# Patient Record
Sex: Female | Born: 1990 | Race: Black or African American | Hispanic: No | Marital: Single | Smoking: Current some day smoker
Health system: Southern US, Community
[De-identification: ages and names within clinical notes are randomized; demographics above are authoritative.]

## PROBLEM LIST (undated history)

## (undated) DIAGNOSIS — R7303 Prediabetes: Secondary | ICD-10-CM

## (undated) DIAGNOSIS — K219 Gastro-esophageal reflux disease without esophagitis: Secondary | ICD-10-CM

## (undated) DIAGNOSIS — N939 Abnormal uterine and vaginal bleeding, unspecified: Secondary | ICD-10-CM

## (undated) DIAGNOSIS — F329 Major depressive disorder, single episode, unspecified: Secondary | ICD-10-CM

## (undated) DIAGNOSIS — R519 Headache, unspecified: Secondary | ICD-10-CM

## (undated) DIAGNOSIS — R51 Headache: Secondary | ICD-10-CM

## (undated) DIAGNOSIS — F32A Depression, unspecified: Secondary | ICD-10-CM

## (undated) HISTORY — PX: NO PAST SURGERIES: SHX2092

## (undated) HISTORY — DX: Abnormal uterine and vaginal bleeding, unspecified: N93.9

---

## 2015-07-03 ENCOUNTER — Emergency Department
Admission: EM | Admit: 2015-07-03 | Discharge: 2015-07-03 | Disposition: A | Discharge status: Home / Self Care | Payer: BLUE CROSS/BLUE SHIELD | Attending: Emergency Medicine | Admitting: Emergency Medicine

## 2015-07-03 DIAGNOSIS — S3992XA Unspecified injury of lower back, initial encounter: Secondary | ICD-10-CM | POA: Diagnosis present

## 2015-07-03 DIAGNOSIS — Y9389 Activity, other specified: Secondary | ICD-10-CM | POA: Insufficient documentation

## 2015-07-03 DIAGNOSIS — Y990 Civilian activity done for income or pay: Secondary | ICD-10-CM | POA: Diagnosis not present

## 2015-07-03 DIAGNOSIS — Y9289 Other specified places as the place of occurrence of the external cause: Secondary | ICD-10-CM | POA: Insufficient documentation

## 2015-07-03 DIAGNOSIS — X58XXXA Exposure to other specified factors, initial encounter: Secondary | ICD-10-CM | POA: Diagnosis not present

## 2015-07-03 DIAGNOSIS — S39012A Strain of muscle, fascia and tendon of lower back, initial encounter: Secondary | ICD-10-CM | POA: Insufficient documentation

## 2015-07-03 MED ORDER — CYCLOBENZAPRINE HCL 10 MG PO TABS
5.0000 mg | ORAL_TABLET | ORAL | Status: AC
  Administered 2015-07-03: 5 mg via ORAL

## 2015-07-03 MED ORDER — IBUPROFEN 600 MG PO TABS: ORAL_TABLET | ORAL | Status: DC

## 2015-07-03 MED ORDER — IBUPROFEN 600 MG PO TABS
600.0000 mg | ORAL_TABLET | ORAL | Status: AC
  Administered 2015-07-03: 600 mg via ORAL

## 2015-07-03 MED ORDER — CYCLOBENZAPRINE HCL 5 MG PO TABS: 5.0000 mg | ORAL_TABLET | Freq: Three times a day (TID) | ORAL | Status: DC | PRN

## 2015-07-03 MED ORDER — HYDROCODONE-ACETAMINOPHEN 5-325 MG PO TABS
1.0000 | ORAL_TABLET | ORAL | Status: AC
  Administered 2015-07-03: 1 via ORAL

## 2015-07-03 NOTE — ED Notes (Signed)
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

## 2015-07-03 NOTE — Discharge Instructions (Signed)
You have been seen in the Emergency Department (ED)  today for back pain.  You are likely suffering from muscle strain or possible problems with your discs, but there is no treatment that will fix your symptoms at this time.  Please take Motrin (ibuprofen) as needed for your pain according to the instructions written on the box.  Alternatively, for the next five days you can take 600mg  three times daily with meals (it may upset your stomach).  Please follow up with your doctor as soon as possible regarding today's ED visit and your back pain.  Return to the ED for worsening back pain, fever, weakness or numbness of either leg, or if you develop either (1) an inability to urinate or have bowel movements, or (2) loss of your ability to control your bathroom functions (if you start having "accidents"), or if you develop other new symptoms that concern you.   Low Back Strain A strain is an injury in which a tendon or muscle is torn. The muscles and tendons of the lower back are vulnerable to strains. However, these muscles and tendons are very strong and require a great force to be injured. Strains are classified into three categories. Grade 1 strains cause pain, but the tendon is not lengthened. Grade 2 strains include a lengthened ligament, due to the ligament being stretched or partially ruptured. With grade 2 strains there is still function, although the function may be decreased. Grade 3 strains involve a complete tear of the tendon or muscle, and function is usually impaired. SYMPTOMS   Pain in the lower back.  Pain that affects one side more than the other.  Pain that gets worse with movement and may be felt in the hip, buttocks, or back of the thigh.  Muscle spasms of the muscles in the back.  Swelling along the muscles of the back.  Loss of strength of the back muscles.  Crackling sound (crepitation) when the muscles are touched. CAUSES  Lower back strains occur when a force is placed on  the muscles or tendons that is greater than they can handle. Common causes of injury include:  Prolonged overuse of the muscle-tendon units in the lower back, usually from incorrect posture.  A single violent injury or force applied to the back. RISK INCREASES WITH:  Sports that involve twisting forces on the spine or a lot of bending at the waist (football, rugby, weightlifting, bowling, golf, tennis, speed skating, racquetball, swimming, running, gymnastics, diving).  Poor strength and flexibility.  Failure to warm up properly before activity.  Family history of lower back pain or disk disorders.  Previous back injury or surgery (especially fusion).  Poor posture with lifting, especially heavy objects.  Prolonged sitting, especially with poor posture. PREVENTION   Learn and use proper posture when sitting or lifting (maintain proper posture when sitting, lift using the knees and legs, not at the waist).  Warm up and stretch properly before activity.  Allow for adequate recovery between workouts.  Maintain physical fitness:  Strength, flexibility, and endurance.  Cardiovascular fitness. PROGNOSIS  If treated properly, lower back strains usually heal within 6 weeks. RELATED COMPLICATIONS   Recurring symptoms, resulting in a chronic problem.  Chronic inflammation, scarring, and partial muscle-tendon tear.  Delayed healing or resolution of symptoms.  Prolonged disability. TREATMENT  Treatment first involves the use of ice and medicine, to reduce pain and inflammation. The use of strengthening and stretching exercises may help reduce pain with activity. These exercises may be  performed at home or with a therapist. Severe injuries may require referral to a therapist for further evaluation and treatment, such as ultrasound. Your caregiver may advise that you wear a back brace or corset, to help reduce pain and discomfort. Often, prolonged bed rest results in greater harm then  benefit. Corticosteroid injections may be recommended. However, these should be reserved for the most serious cases. It is important to avoid using your back when lifting objects. At night, sleep on your back on a firm mattress with a pillow placed under your knees. If non-surgical treatment is unsuccessful, surgery may be needed.  MEDICATION   If pain medicine is needed, nonsteroidal anti-inflammatory medicines (aspirin and ibuprofen), or other minor pain relievers (acetaminophen), are often advised.  Do not take pain medicine for 7 days before surgery.  Prescription pain relievers may be given, if your caregiver thinks they are needed. Use only as directed and only as much as you need.  Ointments applied to the skin may be helpful.  Corticosteroid injections may be given by your caregiver. These injections should be reserved for the most serious cases, because they may only be given a certain number of times. HEAT AND COLD  Cold treatment (icing) should be applied for 10 to 15 minutes every 2 to 3 hours for inflammation and pain, and immediately after activity that aggravates your symptoms. Use ice packs or an ice massage.  Heat treatment may be used before performing stretching and strengthening activities prescribed by your caregiver, physical therapist, or athletic trainer. Use a heat pack or a warm water soak. SEEK MEDICAL CARE IF:   Symptoms get worse or do not improve in 2 to 4 weeks, despite treatment.  You develop numbness, weakness, or loss of bowel or bladder function.  New, unexplained symptoms develop. (Drugs used in treatment may produce side effects.)

## 2015-07-03 NOTE — ED Provider Notes (Signed)
[facility] Emergency Department Provider Note  ____________________________________________  Time seen: Approximately 1:01 AM  I have reviewed the triage vital signs and the nursing notes.   HISTORY  Chief Complaint Back Pain    HPI Briana Sexton is a 24 y.o. female with no past medical history other than morbid obesity who presents with low back pain for the last 7-8 hours.  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  It is a sharp and cramping pain that waxes and wanes.Movement makes it worse and nothing makes it better.  She is able to ambulate, has no numbness or weakness in her saddle region or in any of her extremities, and has had no urinary retention/incontinence nor bowel incontinence.   No past medical history on file.  There are no active problems to display for this patient.   No past surgical history on file.  Current Outpatient Rx  Name  Route  Sig  Dispense  Refill  . cyclobenzaprine (FLEXERIL) 5 MG tablet   Oral   Take 1 tablet (5 mg total) by mouth every 8 (eight) hours as needed for muscle spasms.   21 tablet   1   . ibuprofen (ADVIL,MOTRIN) 600 MG tablet      Take 1 tablet by mouth three times daily with meals   15 tablet   0     Allergies Review of patient's allergies indicates no known allergies.  No family history on file.  Social History Social History  Substance Use Topics  . Smoking status: Not on file  . Smokeless tobacco: Not on file  . Alcohol Use: Not on file    Review of Systems Constitutional: No fever/chills Eyes: No visual changes. ENT: No sore throat. Cardiovascular: Denies chest pain. Respiratory: Denies shortness of breath. Gastrointestinal: No abdominal pain.  No nausea, no vomiting.  No diarrhea.  No constipation. Genitourinary: Negative for dysuria. Musculoskeletal: Low back pain in the middle Skin: Negative for  rash. Neurological: Negative for headaches, focal weakness or numbness.  10-point ROS otherwise negative.  ____________________________________________   PHYSICAL EXAM:  VITAL SIGNS: ED Triage Vitals  Enc Vitals Group     BP 07/03/15 0027 129/69 mmHg     Pulse Rate 07/03/15 0027 89     Resp 07/03/15 0027 20     Temp 07/03/15 0027 98.3 F (36.8 C)     Temp Source 07/03/15 0027 Oral     SpO2 07/03/15 0027 100 %     Weight 07/03/15 0027 234 lb (106.142 kg)     Height 07/03/15 0027  (1.6 m)     Head Cir --      Peak Flow --      Pain Score 07/03/15 0027 4     Pain Loc --      Pain Edu? --      Excl. in GC? --     Constitutional: Alert and oriented. Well appearing and in no acute distress. Eyes: Conjunctivae are normal. PERRL. EOMI. Head: Atraumatic. Nose: No congestion/rhinnorhea. Mouth/Throat: Mucous membranes are moist.  Oropharynx non-erythematous. Cardiovascular: Normal rate, regular rhythm. Grossly normal heart sounds.  Good peripheral circulation. Respiratory: Normal respiratory effort.  No retractions. Lungs CTAB. Gastrointestinal: Soft and nontender. No distention. No abdominal bruits. No CVA tenderness. Musculoskeletal: Pain in the low back in the middle without any bony point tenderness or step-offs or deformities. Neurologic:  Normal speech and language.  No gross focal neurologic deficits are appreciated.  Ambulates with a slight limp Skin:  Skin is warm, dry and intact. No rash noted. Psychiatric: Mood and affect are normal. Speech and behavior are normal.  ____________________________________________   LABS (all labs ordered are listed, but only abnormal results are displayed)  Labs Reviewed - No data to display ____________________________________________  EKG  Not indicated ____________________________________________  RADIOLOGY   No results found.  ____________________________________________   PROCEDURES  Procedure(s) performed:  None  Critical Care performed: No ____________________________________________   INITIAL IMPRESSION / ASSESSMENT AND PLAN / ED COURSE  Pertinent labs & imaging results that were available during my care of the patient were reviewed by me and considered in my medical decision making (see chart for details).  Well-appearing, suspect musculoskeletal lower back strain.  No evidence of neurological deficits or anything concerning for cauda equina syndrome.  I will treat her symptomatically and give her my usual and customary return precautions.  She agrees with this plan.  ____________________________________________  FINAL CLINICAL IMPRESSION(S) / ED DIAGNOSES  Final diagnoses:  Low back strain, initial encounter      NEW MEDICATIONS STARTED DURING THIS VISIT:  Discharge Medication List as of 07/03/2015  1:15 AM    START taking these medications   Details  cyclobenzaprine (FLEXERIL) 5 MG tablet Take 1 tablet (5 mg total) by mouth every 8 (eight) hours as needed for muscle spasms., Starting 07/03/2015, Until Mon 07/02/16, Print    ibuprofen (ADVIL,MOTRIN) 600 MG tablet Take 1 tablet by mouth three times daily with meals, Print         Loleta Roseory Dreshaun Stene, MD 07/03/15 [phone]

## 2015-07-09 ENCOUNTER — Ambulatory Visit (INDEPENDENT_AMBULATORY_CARE_PROVIDER_SITE_OTHER): Payer: BLUE CROSS/BLUE SHIELD | Admitting: Urgent Care

## 2015-07-09 VITALS — BP 124/78 | HR 84 | Temp 98.7°F | Resp 16 | Ht 63.0 in | Wt 241.0 lb

## 2015-07-09 DIAGNOSIS — Z3009 Encounter for other general counseling and advice on contraception: Secondary | ICD-10-CM

## 2015-07-09 DIAGNOSIS — R102 Pelvic and perineal pain: Secondary | ICD-10-CM | POA: Diagnosis not present

## 2015-07-09 DIAGNOSIS — B373 Candidiasis of vulva and vagina: Secondary | ICD-10-CM

## 2015-07-09 DIAGNOSIS — B3731 Acute candidiasis of vulva and vagina: Secondary | ICD-10-CM

## 2015-07-09 DIAGNOSIS — R8290 Unspecified abnormal findings in urine: Secondary | ICD-10-CM

## 2015-07-09 DIAGNOSIS — R300 Dysuria: Secondary | ICD-10-CM

## 2015-07-09 LAB — POCT URINALYSIS DIP (MANUAL ENTRY)
BILIRUBIN UA: NEGATIVE
Bilirubin, UA: NEGATIVE
Blood, UA: NEGATIVE
GLUCOSE UA: NEGATIVE
Leukocytes, UA: NEGATIVE
Nitrite, UA: NEGATIVE
Protein Ur, POC: NEGATIVE
SPEC GRAV UA: 1.025
Urobilinogen, UA: 0.2
pH, UA: 6

## 2015-07-09 LAB — POC MICROSCOPIC URINALYSIS (UMFC): Yeast: POSITIVE

## 2015-07-09 LAB — POCT WET + KOH PREP
Trich by wet prep: ABSENT
YEAST BY KOH: ABSENT
YEAST BY WET PREP: ABSENT

## 2015-07-09 MED ORDER — FLUCONAZOLE 150 MG PO TABS
150.0000 mg | ORAL_TABLET | Freq: Once | ORAL | Status: DC
Start: 2015-07-09 — End: 2017-10-09

## 2015-07-09 MED ORDER — NORGESTIMATE-ETH ESTRADIOL 0.25-35 MG-MCG PO TABS: 1.0000 | ORAL_TABLET | Freq: Every day | ORAL | Status: DC

## 2015-07-09 NOTE — Progress Notes (Signed)
MRN: [id number] DOB: [birth year]  Subjective:   Briana Sexton is a 24 y.o. female presenting for chief complaint of Dysuria  Malodorous urine - Reports 3 day history of hematuria and cloudy malordorous urine, low back pain that started ~1 week ago. Has not tried any medications for her urinary symptoms. She was seen in the ED for her low back pain, thought be musculoskeletal in etiology, rx ibuprofen and flexeril with significant relief of her back pain. Has also had constipation this week, straining when she defecates, hard stools, bowel movements every other 1-2 days. Denies dysuria, urinary frequency, urinary urgency, flank pain, abdominal pain, pelvic pain, genital rash, genital irritation and vaginal discharge.   OCP - Patient would like oral contraception. States that she has not had a pap smear performed. She saw a gynecologist for an acute vaginal bleeding episode associated with sex, was told that she had a "problem" with her cervix. She has not had OB/GYN care since then. Denies irregular, heavy cycles. LMP was 06/16/2015.  Denies any other aggravating or relieving factors, no other questions or concerns.  Briana Sexton has a current medication list which includes the following prescription(s): ibuprofen. She has No Known Allergies.  Briana Sexton  has no past medical history on file. Also  has no past surgical history on file.  ROS As in subjective.  Objective:   Vitals: BP 124/78 mmHg  Pulse 84  Temp(Src) 98.7 F (37.1 C) (Oral)  Resp 16  Ht  (1.6 m)  Wt 241 lb (109.317 kg)  BMI 42.70 kg/m2  SpO2 98%  LMP 06/19/2015 (Approximate)  Physical Exam  Constitutional: She is oriented to person, place, and time. She appears well-developed and well-nourished.  Cardiovascular: Normal rate, regular rhythm and intact distal pulses.  Exam reveals no gallop and no friction rub.   No murmur heard. Pulmonary/Chest: No respiratory distress. She has no wheezes. She has no rales.    Abdominal: Soft. Bowel sounds are normal. She exhibits no distension and no mass. There is tenderness (mid pelvic tenderness with deep palpation).  No CVA tenderness.  Genitourinary: No labial fusion. There is no rash, tenderness, lesion or injury on the right labia. There is no rash, tenderness, lesion or injury on the left labia. Uterus is not deviated, not enlarged, not fixed and not tender. Cervix exhibits no motion tenderness, no discharge and no friability. Right adnexum displays no mass, no tenderness and no fullness. Left adnexum displays no mass, no tenderness and no fullness. No erythema, tenderness or bleeding in the vagina. No foreign body around the vagina. No signs of injury around the vagina. Vaginal discharge (white, curdy discharge) found.  Musculoskeletal: She exhibits no edema.  Lymphadenopathy:       Right: No inguinal adenopathy present.       Left: No inguinal adenopathy present.  Neurological: She is alert and oriented to person, place, and time.  Skin: Skin is warm and dry. No rash noted. No erythema. No pallor.   Results for orders placed or performed in visit on 07/09/15 (from the past 24 hour(s))  POCT urinalysis dipstick     Status: Abnormal   Collection Time: 07/09/15 12:58 PM  Result Value Ref Range   Color, UA yellow yellow   Clarity, UA cloudy (A) clear   Glucose, UA negative negative   Bilirubin, UA negative negative   Ketones, POC UA negative negative   Spec Grav, UA 1.025    Blood, UA negative negative   pH, UA 6.0  Protein Ur, POC negative negative   Urobilinogen, UA 0.2    Nitrite, UA Negative Negative   Leukocytes, UA Negative Negative  POCT Microscopic Urinalysis ([facility])     Status: Abnormal   Collection Time: 07/09/15 12:58 PM  Result Value Ref Range   WBC,UR,HPF,POC Few (A) None WBC/hpf   RBC,UR,HPF,POC None None RBC/hpf   Bacteria Many (A) None, Too numerous to count   Mucus Present (A) Absent   Epithelial Cells, UR Per Microscopy Moderate  (A) None, Too numerous to count cells/hpf   Yeast pos   POCT Wet + KOH Prep     Status: Abnormal   Collection Time: 07/09/15 12:59 PM  Result Value Ref Range   Yeast by KOH Absent Present, Absent   Yeast by wet prep Absent Present, Absent   WBC by wet prep Few None, Few, Too numerous to count   Clue Cells Wet Prep HPF POC None None, Too numerous to count   Trich by wet prep Absent Present, Absent   Bacteria Wet Prep HPF POC Moderate (A) None, Few, Too numerous to count   Epithelial Cells By Principal FinancialWet Pref ([facility]) Moderate (A) None, Few, Too numerous to count   RBC,UR,HPF,POC None None RBC/hpf    Assessment and Plan :   1. Yeast vaginitis 2. Dysuria 3. Abnormal urinalysis 4. Pelvic pain in female - Start diflucan today, consider Flagyl if no improvement in 1 week. Also consider further testing/imaging if sx do not resolve, pelvic US, diabetes check. Patient verbalized understanding.   5. Encounter for other general counseling or advice on contraception - Labs pending, OCP instructions provided, counseled on safe sex practices. RTC in 1 year for refill.  Wallis BambergMario Tay Whitwell, PA-C [facility] [phone] 07/09/2015 11:44 AM

## 2015-07-09 NOTE — Patient Instructions (Signed)
Monilial Vaginitis Vaginitis in a soreness, swelling and redness (inflammation) of the vagina and vulva. Monilial vaginitis is not a sexually transmitted infection. CAUSES  Yeast vaginitis is caused by yeast (candida) that is normally found in your vagina. With a yeast infection, the candida has overgrown in number to a point that upsets the chemical balance. SYMPTOMS   White, thick vaginal discharge.  Swelling, itching, redness and irritation of the vagina and possibly the lips of the vagina (vulva).  Burning or painful urination.  Painful intercourse. DIAGNOSIS  Things that may contribute to monilial vaginitis are:  Postmenopausal and virginal states.  Pregnancy.  Infections.  Being tired, sick or stressed, especially if you had monilial vaginitis in the past.  Diabetes. Good control will help lower the chance.  Birth control pills.  Tight fitting garments.  Using bubble bath, feminine sprays, douches or deodorant tampons.  Taking certain medications that kill germs (antibiotics).  Sporadic recurrence can occur if you become ill. TREATMENT  Your caregiver will give you medication.  There are several kinds of anti monilial vaginal creams and suppositories specific for monilial vaginitis. For recurrent yeast infections, use a suppository or cream in the vagina 2 times a week, or as directed.  Anti-monilial or steroid cream for the itching or irritation of the vulva may also be used. Get your caregiver's permission.  Painting the vagina with methylene blue solution may help if the monilial cream does not work.  Eating yogurt may help prevent monilial vaginitis. HOME CARE INSTRUCTIONS   Finish all medication as prescribed.  Do not have sex until treatment is completed or after your caregiver tells you it is okay.  Take warm sitz baths.  Do not douche.  Do not use tampons, especially scented ones.  Wear cotton underwear.  Avoid tight pants and panty  hose.  Tell your sexual partner that you have a yeast infection. They should go to their caregiver if they have symptoms such as mild rash or itching.  Your sexual partner should be treated as well if your infection is difficult to eliminate.  Practice safer sex. Use condoms.  Some vaginal medications cause latex condoms to fail. Vaginal medications that harm condoms are:  Cleocin cream.  Butoconazole (Femstat).  Terconazole (Terazol) vaginal suppository.  Miconazole (Monistat) (may be purchased over the counter). SEEK MEDICAL CARE IF:   You have a temperature by mouth above 102 F (38.9 C).  The infection is getting worse after 2 days of treatment.  The infection is not getting better after 3 days of treatment.  You develop blisters in or around your vagina.  You develop vaginal bleeding, and it is not your menstrual period.  You have pain when you urinate.  You develop intestinal problems.  You have pain with sexual intercourse.   This information is not intended to replace advice given to you by your health care provider. Make sure you discuss any questions you have with your health care provider.   Document Released: 05/23/2005 Document Revised: 11/05/2011 Document Reviewed: 02/14/2015 Elsevier Interactive Patient Education 2016 [employer].   Safe Sex Safe sex is about reducing the risk of giving or getting a sexually transmitted disease (STD). STDs are spread through sexual contact involving the genitals, mouth, or rectum. Some STDs can be cured and others cannot. Safe sex can also prevent unintended pregnancies.  WHAT ARE SOME SAFE SEX PRACTICES?  Limit your sexual activity to only one partner who is having sex with only you.  Talk to your  partner about his or her past partners, past STDs, and drug use.  Use a condom every time you have sexual intercourse. This includes vaginal, oral, and anal sexual activity. Both females and males should wear condoms  during oral sex. Only use latex or polyurethane condoms and water-based lubricants. Using petroleum-based lubricants or oils to lubricate a condom will weaken the condom and increase the chance that it will break. The condom should be in place from the beginning to the end of sexual activity. Wearing a condom reduces, but does not completely eliminate, your risk of getting or giving an STD. STDs can be spread by contact with infected body fluids and skin.  Get vaccinated for hepatitis B and HPV.  Avoid alcohol and recreational drugs, which can affect your judgment. You may forget to use a condom or participate in high-risk sex.  For females, avoid douching after sexual intercourse. Douching can spread an infection farther into the reproductive tract.  Check your body for signs of sores, blisters, rashes, or unusual discharge. See your health care provider if you notice any of these signs.  Avoid sexual contact if you have symptoms of an infection or are being treated for an STD. If you or your partner has herpes, avoid sexual contact when blisters are present. Use condoms at all other times.  If you are at risk of being infected with HIV, it is recommended that you take a prescription medicine daily to prevent HIV infection. This is called pre-exposure prophylaxis (PrEP). You are considered at risk if:  You are a man who has sex with other men (MSM).  You are a heterosexual man or woman who is sexually active with more than one partner.  You take drugs by injection.  You are sexually active with a partner who has HIV.  Talk with your health care provider about whether you are at high risk of being infected with HIV. If you choose to begin PrEP, you should first be tested for HIV. You should then be tested every 3 months for as long as you are taking PrEP.  See your health care provider for regular screenings, exams, and tests for other STDs. Before having sex with a new partner, each of you  should be screened for STDs and should talk about the results with each other. WHAT ARE THE BENEFITS OF SAFE SEX?   There is less chance of getting or giving an STD.  You can prevent unwanted or unintended pregnancies.  By discussing safe sex concerns with your partner, you may increase feelings of intimacy, comfort, trust, and honesty between the two of you.   This information is not intended to replace advice given to you by your health care provider. Make sure you discuss any questions you have with your health care provider.   Document Released: 09/20/2004 Document Revised: 09/03/2014 Document Reviewed: 02/04/2012 Elsevier Interactive Patient Education [employer]2016 Elsevier Inc.

## 2015-07-12 LAB — PAP IG W/ RFLX HPV ASCU

## 2015-07-14 ENCOUNTER — Telehealth: Payer: Self-pay | Admitting: Urgent Care

## 2015-07-14 MED ORDER — NITROFURANTOIN MONOHYD MACRO 100 MG PO CAPS: 100.0000 mg | ORAL_CAPSULE | Freq: Two times a day (BID) | ORAL | Status: DC

## 2015-07-14 NOTE — Telephone Encounter (Signed)
Reported normal pap smear. Repeat again in 3 years. She reports that she took diflucan twice already. She reports that she is now having dysuria, urinary frequency, cloudy malodorous urine has persisted. I did not prescribe antibiotics for UTI at her last visit on 07/07/2015 because the UA did not support this. [facility]. RTC in 3-4 days if no improvement for re-evaluation.

## 2016-01-19 ENCOUNTER — Ambulatory Visit: Payer: 59 | Admitting: Psychology

## 2016-02-02 ENCOUNTER — Ambulatory Visit (INDEPENDENT_AMBULATORY_CARE_PROVIDER_SITE_OTHER): Payer: 59 | Admitting: Psychology

## 2016-02-02 DIAGNOSIS — F330 Major depressive disorder, recurrent, mild: Secondary | ICD-10-CM | POA: Diagnosis not present

## 2016-02-14 ENCOUNTER — Ambulatory Visit (INDEPENDENT_AMBULATORY_CARE_PROVIDER_SITE_OTHER): Payer: 59 | Admitting: Psychology

## 2016-02-14 DIAGNOSIS — F331 Major depressive disorder, recurrent, moderate: Secondary | ICD-10-CM

## 2016-03-06 ENCOUNTER — Ambulatory Visit: Payer: 59 | Admitting: Psychology

## 2016-03-15 ENCOUNTER — Ambulatory Visit: Payer: 59 | Admitting: Psychology

## 2016-03-27 ENCOUNTER — Ambulatory Visit: Payer: 59 | Admitting: Psychology

## 2016-05-02 ENCOUNTER — Encounter (HOSPITAL_COMMUNITY): Payer: Self-pay

## 2016-05-02 ENCOUNTER — Emergency Department (HOSPITAL_COMMUNITY)
Admission: EM | Admit: 2016-05-02 | Discharge: 2016-05-03 | Disposition: A | Discharge status: Home / Self Care | Payer: 59 | Attending: Emergency Medicine | Admitting: Emergency Medicine

## 2016-05-02 DIAGNOSIS — Z79899 Other long term (current) drug therapy: Secondary | ICD-10-CM | POA: Insufficient documentation

## 2016-05-02 DIAGNOSIS — K0889 Other specified disorders of teeth and supporting structures: Secondary | ICD-10-CM | POA: Diagnosis not present

## 2016-05-02 DIAGNOSIS — F17210 Nicotine dependence, cigarettes, uncomplicated: Secondary | ICD-10-CM | POA: Insufficient documentation

## 2016-05-02 HISTORY — DX: Depression, unspecified: F32.A

## 2016-05-02 HISTORY — DX: Major depressive disorder, single episode, unspecified: F32.9

## 2016-05-02 NOTE — ED Triage Notes (Signed)
Pt complains of a dental abcess on the right lower side for a few days

## 2016-05-02 NOTE — ED Notes (Signed)
Bed: WTR6 Expected date:  Expected time:  Means of arrival:  Comments: 

## 2016-05-03 MED ORDER — PENICILLIN V POTASSIUM 500 MG PO TABS: 500.0000 mg | ORAL_TABLET | Freq: Three times a day (TID) | ORAL | 0 refills | Status: DC

## 2016-05-03 MED ORDER — ACETAMINOPHEN 500 MG PO TABS
1000.0000 mg | ORAL_TABLET | Freq: Once | ORAL | Status: AC
  Administered 2016-05-03: 1000 mg via ORAL
  Filled 2016-05-03: qty 2

## 2016-05-03 MED ORDER — BUPIVACAINE-EPINEPHRINE (PF) 0.5% -1:200000 IJ SOLN
1.8000 mL | Freq: Once | INTRAMUSCULAR | Status: AC
  Administered 2016-05-03: 1.8 mL
  Filled 2016-05-03: qty 1.8

## 2016-05-03 NOTE — ED Provider Notes (Signed)
[facility]-EMERGENCY DEPT Provider Note   CSN: [id number] Arrival date & time: 05/02/16  2316   By signing my name below, I, Christy SartoriusAnastasia Kolousek, attest that this documentation has been prepared under the direction and in the presence of TXU CorpHannah Rayli Wiederhold, PA-C. Electronically Signed: Christy SartoriusAnastasia Kolousek, ED Scribe. 05/03/16. 12:15 AM.  History   Chief Complaint Chief Complaint  Patient presents with  . Dental Pain   The history is provided by the patient and medical records. No language interpreter was used.    HPI Comments:  Briana Sexton is a 25 y.o. female who presents to the Emergency Department complaining of gradually worsening lower right sided dental pain beginning a year ago.  Pt states that she has a history of dental abscesses and that this feels similar.  She's seen a dentist who's filled her teeth and pulled her back teeth, but the pt states it hasn't helped her pain.  She took left over clindamycin starting today.  She also took ibuprofen at 2030.   She denies fever, chills, nausea and vomiting.    Past Medical History:  Diagnosis Date  . Depression     There are no active problems to display for this patient.   History reviewed. No pertinent surgical history.  OB History    No data available       Home Medications    Prior to Admission medications   Medication Sig Start Date End Date Taking? Authorizing Provider  fluconazole (DIFLUCAN) 150 MG tablet Take 1 tablet (150 mg total) by mouth once. Repeat in 72 hours. 07/09/15   Wallis BambergMario Mani, PA-C  ibuprofen (ADVIL,MOTRIN) 600 MG tablet Take 1 tablet by mouth three times daily with meals 07/03/15   Loleta Roseory Forbach, MD  nitrofurantoin, macrocrystal-monohydrate, (MACROBID) 100 MG capsule Take 1 capsule (100 mg total) by mouth 2 (two) times daily. 07/14/15   Wallis BambergMario Mani, PA-C  norgestimate-ethinyl estradiol (ORTHO-CYCLEN,SPRINTEC,PREVIFEM) 0.25-35 MG-MCG tablet Take 1 tablet by mouth daily. 07/09/15   Wallis BambergMario Mani, PA-C    penicillin v potassium (VEETID) 500 MG tablet Take 1 tablet (500 mg total) by mouth 3 (three) times daily. 05/03/16   Dahlia ClientHannah Ronne Stefanski, PA-C    Family History History reviewed. No pertinent family history.  Social History Social History  Substance Use Topics  . Smoking status: Current Every Day Smoker    Packs/day: 0.50    Years: 3.00    Types: Cigarettes  . Smokeless tobacco: Never Used  . Alcohol use 0.0 oz/week     Allergies   Review of patient's allergies indicates no known allergies.   Review of Systems Review of Systems  Constitutional: Negative for chills and fever.  HENT: Positive for dental problem.   Gastrointestinal: Negative for nausea and vomiting.  All other systems reviewed and are negative.    Physical Exam Updated Vital Signs BP 153/96 (BP Location: Left Arm)   Pulse 71   Temp 98.3 F (36.8 C)   Resp 20   LMP 10/31/2015   SpO2 95%   Physical Exam  Constitutional: She appears well-developed and well-nourished.  HENT:  Head: Normocephalic.  Right Ear: Tympanic membrane, external ear and ear canal normal.  Left Ear: Tympanic membrane, external ear and ear canal normal.  Nose: Nose normal. Right sinus exhibits no maxillary sinus tenderness and no frontal sinus tenderness. Left sinus exhibits no maxillary sinus tenderness and no frontal sinus tenderness.  Mouth/Throat: Uvula is midline, oropharynx is clear and moist and mucous membranes are normal. No oral lesions. Abnormal dentition. Dental caries  present. No uvula swelling or lacerations. No oropharyngeal exudate, posterior oropharyngeal edema, posterior oropharyngeal erythema or tonsillar abscesses.  Tooth # 31 & 32 with TTP and mild erythema of the gingiva Multiple dental caries throughout the mouth No gross abscess No fluctuance or induration to the buccal mucosa or floor of the mouth  Eyes: Conjunctivae are normal. Pupils are equal, round, and reactive to light. Right eye exhibits no  discharge. Left eye exhibits no discharge.  Neck: Normal range of motion. Neck supple.  No stridor Handling secretions without difficulty No nuchal rigidity No cervical lymphadenopathy   Cardiovascular: Normal rate, regular rhythm and normal heart sounds.   Pulmonary/Chest: Effort normal. No respiratory distress.  Equal chest rise  Abdominal: Soft. Bowel sounds are normal. She exhibits no distension. There is no tenderness.  Lymphadenopathy:    She has no cervical adenopathy.  Neurological: She is alert.  Skin: Skin is warm and dry.  Psychiatric: She has a normal mood and affect.  Nursing note and vitals reviewed.    ED Treatments / Results   DIAGNOSTIC STUDIES:  Oxygen Saturation is 95% on RA, NML by my interpretation.    COORDINATION OF CARE:  1:28 AM Discussed treatment plan with pt at bedside and pt agreed to plan.  Labs (all labs ordered are listed, but only abnormal results are displayed) Labs Reviewed - No data to display  EKG  EKG Interpretation None       Radiology No results found.  Procedures Dental Date/Time: 05/03/2016 1:27 AM Performed by: Dierdre Forth Authorized by: Dierdre Forth  Consent: Verbal consent obtained. Risks and benefits: risks, benefits and alternatives were discussed Consent given by: patient Required items: required blood products, implants, devices, and special equipment available Patient identity confirmed: verbally with patient and arm band Time out: Immediately prior to procedure a "time out" was called to verify the correct patient, procedure, equipment, support staff and site/side marked as required. Preparation: Patient was prepped and draped in the usual sterile fashion. Local anesthesia used: yes Anesthesia: local infiltration  Anesthesia: Local anesthesia used: yes Local Anesthetic: bupivacaine 0.5% with epinephrine Anesthetic total: 1.8 mL  Sedation: Patient sedated: no Patient tolerance: Patient  tolerated the procedure well with no immediate complications Comments: Dental block of tooth #31 and 32 with complete relief of pain    (including critical care time)  Medications Ordered in ED Medications  acetaminophen (TYLENOL) tablet 1,000 mg (1,000 mg Oral Given 05/03/16 0103)  bupivacaine-epinephrine (MARCAINE W/ EPI) 0.5% -1:200000 injection 1.8 mL (1.8 mLs Infiltration Given 05/03/16 0103)     Initial Impression / Assessment and Plan / ED Course  I have reviewed the triage vital signs and the nursing notes.  Pertinent labs & imaging results that were available during my care of the patient were reviewed by me and considered in my medical decision making (see chart for details).  Clinical Course    Patient with toothache.  No gross abscess.  Exam unconcerning for Ludwig's angina or spread of infection.  Will treat with penicillin and pain medicine.  Urged patient to follow-up with dentist.     Final Clinical Impressions(s) / ED Diagnoses   Final diagnoses:  Pain, dental    New Prescriptions New Prescriptions   PENICILLIN V POTASSIUM (VEETID) 500 MG TABLET    Take 1 tablet (500 mg total) by mouth 3 (three) times daily.    I personally performed the services described in this documentation, which was scribed in my presence. The recorded information has been  reviewed and is accurate.     Dierdre Forth, PA-C 05/03/16 0128    Cy Blamer, MD 05/03/16 [phone]

## 2016-05-03 NOTE — Discharge Instructions (Signed)
1. Medications: ibuprofen and tylenol, penicillin, usual home medications 2. Treatment: rest, drink plenty of fluids, take medications as prescribed 3. Follow Up: Please followup with dentistry within 1 week for discussion of your diagnoses and further evaluation after today's visit; if you do not have a primary care doctor use the resource guide provided to find one; Return to the ER for high fevers, difficulty breathing, difficulty swallowing or other concerning symptoms

## 2016-05-18 ENCOUNTER — Ambulatory Visit: Payer: 59 | Admitting: Psychology

## 2016-05-23 ENCOUNTER — Ambulatory Visit (INDEPENDENT_AMBULATORY_CARE_PROVIDER_SITE_OTHER): Payer: 59 | Admitting: Psychology

## 2016-05-23 DIAGNOSIS — F330 Major depressive disorder, recurrent, mild: Secondary | ICD-10-CM | POA: Diagnosis not present

## 2016-05-29 ENCOUNTER — Ambulatory Visit: Payer: Self-pay | Admitting: Psychology

## 2016-05-31 ENCOUNTER — Ambulatory Visit (INDEPENDENT_AMBULATORY_CARE_PROVIDER_SITE_OTHER): Payer: 59 | Admitting: Psychology

## 2016-05-31 DIAGNOSIS — F4323 Adjustment disorder with mixed anxiety and depressed mood: Secondary | ICD-10-CM

## 2016-06-19 ENCOUNTER — Ambulatory Visit (INDEPENDENT_AMBULATORY_CARE_PROVIDER_SITE_OTHER): Payer: 59 | Admitting: Psychology

## 2016-06-19 DIAGNOSIS — F4323 Adjustment disorder with mixed anxiety and depressed mood: Secondary | ICD-10-CM

## 2016-07-16 ENCOUNTER — Ambulatory Visit: Payer: 59 | Admitting: Psychology

## 2016-07-23 ENCOUNTER — Ambulatory Visit (INDEPENDENT_AMBULATORY_CARE_PROVIDER_SITE_OTHER): Payer: 59 | Admitting: Psychology

## 2016-07-23 DIAGNOSIS — F4323 Adjustment disorder with mixed anxiety and depressed mood: Secondary | ICD-10-CM | POA: Diagnosis not present

## 2016-07-31 ENCOUNTER — Emergency Department
Admission: EM | Admit: 2016-07-31 | Discharge: 2016-07-31 | Disposition: A | Discharge status: Home / Self Care | Payer: 59 | Attending: Emergency Medicine | Admitting: Emergency Medicine

## 2016-07-31 ENCOUNTER — Encounter: Payer: Self-pay | Admitting: Emergency Medicine

## 2016-07-31 DIAGNOSIS — T783XXA Angioneurotic edema, initial encounter: Secondary | ICD-10-CM

## 2016-07-31 DIAGNOSIS — L509 Urticaria, unspecified: Secondary | ICD-10-CM

## 2016-07-31 DIAGNOSIS — R220 Localized swelling, mass and lump, head: Secondary | ICD-10-CM | POA: Diagnosis present

## 2016-07-31 DIAGNOSIS — F17210 Nicotine dependence, cigarettes, uncomplicated: Secondary | ICD-10-CM | POA: Insufficient documentation

## 2016-07-31 MED ORDER — SODIUM CHLORIDE 0.9 % IV BOLUS (SEPSIS)
1000.0000 mL | Freq: Once | INTRAVENOUS | Status: AC
  Administered 2016-07-31: 1000 mL via INTRAVENOUS

## 2016-07-31 MED ORDER — PREDNISONE 20 MG PO TABS: 60.0000 mg | ORAL_TABLET | Freq: Every day | ORAL | 0 refills | Status: DC

## 2016-07-31 MED ORDER — DIPHENHYDRAMINE HCL 50 MG/ML IJ SOLN
INTRAMUSCULAR | Status: AC
  Administered 2016-07-31: 50 mg via INTRAVENOUS
  Filled 2016-07-31: qty 1

## 2016-07-31 MED ORDER — ONDANSETRON HCL 4 MG/2ML IJ SOLN
INTRAMUSCULAR | Status: AC
  Administered 2016-07-31: 4 mg via INTRAVENOUS
  Filled 2016-07-31: qty 2

## 2016-07-31 MED ORDER — DIPHENHYDRAMINE HCL 50 MG/ML IJ SOLN
50.0000 mg | Freq: Once | INTRAMUSCULAR | Status: AC
  Administered 2016-07-31: 50 mg via INTRAVENOUS

## 2016-07-31 MED ORDER — DIPHENHYDRAMINE HCL 25 MG PO CAPS: 25.0000 mg | ORAL_CAPSULE | ORAL | 0 refills | Status: DC | PRN

## 2016-07-31 MED ORDER — EPINEPHRINE 0.3 MG/0.3ML IJ SOAJ: 0.3000 mg | Freq: Once | INTRAMUSCULAR | 0 refills | Status: AC

## 2016-07-31 MED ORDER — ONDANSETRON HCL 4 MG/2ML IJ SOLN
4.0000 mg | Freq: Once | INTRAMUSCULAR | Status: AC
  Administered 2016-07-31: 4 mg via INTRAVENOUS

## 2016-07-31 MED ORDER — METHYLPREDNISOLONE SODIUM SUCC 125 MG IJ SOLR
125.0000 mg | Freq: Once | INTRAMUSCULAR | Status: AC
  Administered 2016-07-31: 125 mg via INTRAVENOUS

## 2016-07-31 MED ORDER — EPINEPHRINE 0.3 MG/0.3ML IJ SOAJ
0.3000 mg | Freq: Once | INTRAMUSCULAR | Status: AC
  Administered 2016-07-31: 0.3 mg via INTRAMUSCULAR

## 2016-07-31 MED ORDER — FAMOTIDINE IN NACL 20-0.9 MG/50ML-% IV SOLN
20.0000 mg | Freq: Once | INTRAVENOUS | Status: AC
  Administered 2016-07-31: 20 mg via INTRAVENOUS

## 2016-07-31 MED ORDER — METHYLPREDNISOLONE SODIUM SUCC 125 MG IJ SOLR
INTRAMUSCULAR | Status: AC
  Administered 2016-07-31: 125 mg via INTRAVENOUS
  Filled 2016-07-31: qty 2

## 2016-07-31 MED ORDER — CETIRIZINE HCL 10 MG PO TABS
10.0000 mg | ORAL_TABLET | Freq: Every day | ORAL | 0 refills | Status: DC
Start: 2016-07-31 — End: 2023-09-24

## 2016-07-31 MED ORDER — EPINEPHRINE 0.3 MG/0.3ML IJ SOAJ
INTRAMUSCULAR | Status: AC
  Administered 2016-07-31: 0.3 mg via INTRAMUSCULAR
  Filled 2016-07-31: qty 0.3

## 2016-07-31 MED ORDER — FAMOTIDINE IN NACL 20-0.9 MG/50ML-% IV SOLN
INTRAVENOUS | Status: AC
  Administered 2016-07-31: 20 mg via INTRAVENOUS
  Filled 2016-07-31: qty 50

## 2016-07-31 MED ORDER — FAMOTIDINE 40 MG PO TABS: 40.0000 mg | ORAL_TABLET | Freq: Every evening | ORAL | 0 refills | Status: DC

## 2016-07-31 NOTE — ED Triage Notes (Signed)
Pt  Presents to STAT desk  With c/o allergic reaction with angioedema noted. Hives to abd, swelling to right side of top lip, and left eye. Pt reports that these symptoms have occurred intermittently since October with no known cause. Seen by urgent care for the same about 2 weeks ago and given prednisone in addition to "3 allergy medications". Pt states her symptoms had improved until a coupe of hours ago. Pt reports that this times feels different due to her throat feeling scratchy. Denies sob. Answering questions without difficulty.

## 2016-07-31 NOTE — Discharge Instructions (Signed)
Please follow up with the allergist as soon as possible.

## 2016-07-31 NOTE — ED Provider Notes (Signed)
[facility] Emergency Department Provider Note   ____________________________________________   First MD Initiated Contact with Patient 07/31/16 0142     (approximate)  I have reviewed the triage vital signs and the nursing notes.   HISTORY  Chief Complaint Allergic Reaction    HPI Briana Sexton is a 25 y.o. female who comes into the hospital today with some facial swelling. The patient has had some problems with this since October. She reports that she had a cold with some facial swelling but then developed some hives and significant swelling to her lips. The patient reports that she was told by her doctor to take some medication and the symptoms have come and gone since then. The patient went to urgent care 2 weeks ago and was placed on 20 or 40 mg of prednisone for 5 days. She reports that the swelling significantly improved but the hives returned after she completed the prednisone. The patient reports that she's had hives all over her abdomen today but she started noticing some swelling to her upper lip about 2 hours ago. She reports that she was told to return to the hospital if she had any swelling of her throat and the patient states that she feels as though her throat swelling. The patient reports that she took some ibuprofen earlier and some DayQuil this evening. The patient takes escitalopram and she reports that she took yesterday. She takes it intermittently so she is unsure if that may be what is causing her symptoms. The patient denies any shortness of breath or chest pain. She is here this evening for evaluation.   Past Medical History:  Diagnosis Date  . Depression     There are no active problems to display for this patient.   History reviewed. No pertinent surgical history.  Prior to Admission medications   Medication Sig Start Date End Date Taking? Authorizing Provider  escitalopram (LEXAPRO) 10 MG tablet Take 10 mg by mouth daily.   Yes  Historical Provider, MD  etonogestrel (NEXPLANON) 68 MG IMPL implant 1 each by Subdermal route once.   Yes Historical Provider, MD  cetirizine (ZYRTEC) 10 MG tablet Take 1 tablet (10 mg total) by mouth daily. 07/31/16   Rebecka Apley, MD  diphenhydrAMINE (BENADRYL) 25 mg capsule Take 1 capsule (25 mg total) by mouth every 4 (four) hours as needed. 07/31/16 07/31/17  Rebecka Apley, MD  EPINEPHrine (EPIPEN 2-PAK) 0.3 mg/0.3 mL IJ SOAJ injection Inject 0.3 mLs (0.3 mg total) into the muscle once. 07/31/16 07/31/16  Rebecka Apley, MD  famotidine (PEPCID) 40 MG tablet Take 1 tablet (40 mg total) by mouth every evening. 07/31/16 07/31/17  Rebecka Apley, MD  fluconazole (DIFLUCAN) 150 MG tablet Take 1 tablet (150 mg total) by mouth once. Repeat in 72 hours. Patient not taking: Reported on 07/31/2016 07/09/15   Wallis Bamberg, PA-C  ibuprofen (ADVIL,MOTRIN) 600 MG tablet Take 1 tablet by mouth three times daily with meals 07/03/15   Loleta Rose, MD  nitrofurantoin, macrocrystal-monohydrate, (MACROBID) 100 MG capsule Take 1 capsule (100 mg total) by mouth 2 (two) times daily. Patient not taking: Reported on 07/31/2016 07/14/15   Wallis Bamberg, PA-C  norgestimate-ethinyl estradiol (ORTHO-CYCLEN,SPRINTEC,PREVIFEM) 0.25-35 MG-MCG tablet Take 1 tablet by mouth daily. Patient not taking: Reported on 07/31/2016 07/09/15   Wallis Bamberg, PA-C  penicillin v potassium (VEETID) 500 MG tablet Take 1 tablet (500 mg total) by mouth 3 (three) times daily. Patient not taking: Reported on 07/31/2016 05/03/16  Hannah Muthersbaugh, PA-C  predniSONE (DELTASONE) 20 MG tablet Take 3 tablets (60 mg total) by mouth daily. 07/31/16   Rebecka ApleyAllison P Karrin Eisenmenger, MD    Allergies Patient has no known allergies.  No family history on file.  Social History Social History  Substance Use Topics  . Smoking status: Current Every Day Smoker    Packs/day: 0.50    Years: 3.00    Types: Cigarettes  . Smokeless tobacco: Never Used  . Alcohol use  0.0 oz/week    Review of Systems Constitutional: No fever/chills Eyes: No visual changes. ENT: Swelling to lips and throat. Cardiovascular: Denies chest pain. Respiratory: Denies shortness of breath. Gastrointestinal: No abdominal pain.  No nausea, no vomiting.  No diarrhea.  No constipation. Genitourinary: Negative for dysuria. Musculoskeletal: Negative for back pain. Skin: Negative for rash. Neurological: Negative for headaches, focal weakness or numbness.  10-point ROS otherwise negative.  ____________________________________________   PHYSICAL EXAM:  VITAL SIGNS: ED Triage Vitals  Enc Vitals Group     BP 07/31/16 0143 (!) 143/92     Pulse Rate 07/31/16 0143 89     Resp 07/31/16 0143 18     Temp 07/31/16 0143 97.8 F (36.6 C)     Temp Source 07/31/16 0143 Oral     SpO2 07/31/16 0143 99 %     Weight 07/31/16 0144 231 lb (104.8 kg)     Height 07/31/16 0144 5\' 3"  (1.6 m)     Head Circumference --      Peak Flow --      Pain Score 07/31/16 0144 0     Pain Loc --      Pain Edu? --      Excl. in GC? --     Constitutional: Alert and oriented. Well appearing and in Moderate distress. Eyes: Swelling to left eye Conjunctivae are normal. PERRL. EOMI. Head: Atraumatic. Nose: No congestion/rhinnorhea. Mouth/Throat: Swelling to right upper lip that is crossing midline as well as some swelling to the posterior oropharynx Mucous membranes are moist.  Oropharynx non-erythematous. Neck: No stridor.   Cardiovascular: Normal rate, regular rhythm. Grossly normal heart sounds.  Good peripheral circulation. Respiratory: Normal respiratory effort.  No retractions. Lungs CTAB. Gastrointestinal: Soft and nontender. No distention. Positive bowel sounds Musculoskeletal: No lower extremity tenderness nor edema.   Neurologic:  Normal speech and language.  Skin:  Skin is warm, dry and intact. Marland Kitchen. Psychiatric: Mood and affect are normal.  ____________________________________________    LABS (all labs ordered are listed, but only abnormal results are displayed)  Labs Reviewed - No data to display ____________________________________________  EKG  none ____________________________________________  RADIOLOGY  none ____________________________________________   PROCEDURES  Procedure(s) performed: None  Procedures  Critical Care performed: No  ____________________________________________   INITIAL IMPRESSION / ASSESSMENT AND PLAN / ED COURSE  Pertinent labs & imaging results that were available during my care of the patient were reviewed by me and considered in my medical decision making (see chart for details).  This is a 25 year old female who comes into the hospital today with swelling to her lips. The patient reports that she has not eaten or drank anything different from normal. The patient reports that this is been going on for multiple weeks but she has not followed up with an allergist. Given the swelling in the back of the patient's throat I will give her a dose of epinephrine as well as some Benadryl, Solu-Medrol and Pepcid. The patient will also receive a liter of normal saline. The  patient did develop some nausea associated receive some Zofran. I will monitor the patient for approximately 3-4 hours and if her swelling improved she'll be discharged to home to follow-up with an allergist.  Clinical Course    After being here for 4 hours the patient does have some improvement in her symptoms. She will be discharged to home. The patient nor her mother have any further questions.  ____________________________________________   FINAL CLINICAL IMPRESSION(S) / ED DIAGNOSES  Final diagnoses:  Angioedema, initial encounter  Urticaria      NEW MEDICATIONS STARTED DURING THIS VISIT:  New Prescriptions   CETIRIZINE (ZYRTEC) 10 MG TABLET    Take 1 tablet (10 mg total) by mouth daily.   DIPHENHYDRAMINE (BENADRYL) 25 MG CAPSULE    Take 1 capsule (25  mg total) by mouth every 4 (four) hours as needed.   EPINEPHRINE (EPIPEN 2-PAK) 0.3 MG/0.3 ML IJ SOAJ INJECTION    Inject 0.3 mLs (0.3 mg total) into the muscle once.   FAMOTIDINE (PEPCID) 40 MG TABLET    Take 1 tablet (40 mg total) by mouth every evening.   PREDNISONE (DELTASONE) 20 MG TABLET    Take 3 tablets (60 mg total) by mouth daily.     Note:  This document was prepared using Dragon voice recognition software and may include unintentional dictation errors.    Rebecka ApleyAllison P Zaidan Keeble, MD 07/31/16 [phone]

## 2016-09-13 ENCOUNTER — Encounter: Payer: Self-pay | Admitting: Emergency Medicine

## 2016-09-13 DIAGNOSIS — T7840XA Allergy, unspecified, initial encounter: Secondary | ICD-10-CM | POA: Insufficient documentation

## 2016-09-13 DIAGNOSIS — Z791 Long term (current) use of non-steroidal anti-inflammatories (NSAID): Secondary | ICD-10-CM | POA: Diagnosis not present

## 2016-09-13 DIAGNOSIS — Z79899 Other long term (current) drug therapy: Secondary | ICD-10-CM | POA: Diagnosis not present

## 2016-09-13 DIAGNOSIS — F17210 Nicotine dependence, cigarettes, uncomplicated: Secondary | ICD-10-CM | POA: Insufficient documentation

## 2016-09-13 DIAGNOSIS — H02843 Edema of right eye, unspecified eyelid: Secondary | ICD-10-CM | POA: Diagnosis present

## 2016-09-13 MED ORDER — FAMOTIDINE 20 MG PO TABS
20.0000 mg | ORAL_TABLET | Freq: Once | ORAL | Status: AC
  Administered 2016-09-13: 20 mg via ORAL
  Filled 2016-09-13: qty 1

## 2016-09-13 MED ORDER — PREDNISONE 20 MG PO TABS
60.0000 mg | ORAL_TABLET | Freq: Once | ORAL | Status: AC
  Administered 2016-09-13: 60 mg via ORAL
  Filled 2016-09-13: qty 3

## 2016-09-13 NOTE — ED Notes (Signed)
Verbal order given to this RN by Dr. York CeriseForbach to administer 60 mg PO Prednisone and 20 mg Pepcid PO.

## 2016-09-13 NOTE — ED Triage Notes (Signed)
Pt ambulatory to triage with steady gait with c/o allergic reaction Pt reports taking 2 Benadryl pills (50 mg) and allergy medicine. Swelling noted to right eye. Pt speaking in complete sentences, respirations even and unlabored. Pt alert and oriented.

## 2016-09-14 ENCOUNTER — Emergency Department
Admission: EM | Admit: 2016-09-14 | Discharge: 2016-09-14 | Disposition: A | Discharge status: Home / Self Care | Payer: 59 | Attending: Emergency Medicine | Admitting: Emergency Medicine

## 2016-09-14 DIAGNOSIS — T7840XA Allergy, unspecified, initial encounter: Secondary | ICD-10-CM

## 2016-09-14 DIAGNOSIS — H5789 Other specified disorders of eye and adnexa: Secondary | ICD-10-CM

## 2016-09-14 DIAGNOSIS — L299 Pruritus, unspecified: Secondary | ICD-10-CM

## 2016-09-14 MED ORDER — FAMOTIDINE 40 MG PO TABS: 40.0000 mg | ORAL_TABLET | Freq: Every day | ORAL | 0 refills | Status: DC

## 2016-09-14 MED ORDER — PREDNISONE 20 MG PO TABS: 60.0000 mg | ORAL_TABLET | Freq: Every day | ORAL | 0 refills | Status: DC

## 2016-09-14 MED ORDER — EPINEPHRINE 0.3 MG/0.3ML IJ SOAJ: 0.3000 mg | Freq: Once | INTRAMUSCULAR | 0 refills | Status: AC

## 2016-09-14 MED ORDER — IPRATROPIUM-ALBUTEROL 0.5-2.5 (3) MG/3ML IN SOLN
3.0000 mL | Freq: Once | RESPIRATORY_TRACT | Status: AC
  Administered 2016-09-14: 3 mL via RESPIRATORY_TRACT
  Filled 2016-09-14: qty 3

## 2016-09-14 NOTE — ED Provider Notes (Signed)
[facility] Emergency Department Provider Note   ____________________________________________   First MD Initiated Contact with Patient 09/14/16 0120     (approximate)  I have reviewed the triage vital signs and the nursing notes.   HISTORY  Chief Complaint Allergic Reaction    HPI Briana Sexton is a 26 y.o. female who comes into the hospital today with an allergic reaction. She reports that the last time she was here was for throat swelling. She reports that this time she couldn't talk. She reports her eyes are swollen and her voice sounded shaky. She reports that she was itching like she was going to develop some hives. The patient reports that she took 2 Benadryl and an allergy tablet, cetirizine. The patient reports that she still has a little bit of an itch but she is tired. She reports that she is unsure if she has any hives at this point and she is not sure if she is breathing okay or if her throat is swollen. She reports that she is allergic to many foods but does not recall taking anything that would cause the symptoms today. She reports that peanuts is her main allergy but she tries to take some other things in moderation. The patient has had some cheese its, [employer] and starburst dummies. She reports that for lunch she had some McDonald's. The patient is here today for evaluation.   Past Medical History:  Diagnosis Date  . Depression     There are no active problems to display for this patient.   History reviewed. No pertinent surgical history.  Prior to Admission medications   Medication Sig Start Date End Date Taking? Authorizing Provider  cetirizine (ZYRTEC) 10 MG tablet Take 1 tablet (10 mg total) by mouth daily. 07/31/16   Rebecka Apley, MD  diphenhydrAMINE (BENADRYL) 25 mg capsule Take 1 capsule (25 mg total) by mouth every 4 (four) hours as needed. 07/31/16 07/31/17  Rebecka Apley, MD  EPINEPHrine 0.3 mg/0.3 mL IJ SOAJ  injection Inject 0.3 mLs (0.3 mg total) into the muscle once. Take if you develop any difficulty breathing, mouth or throat swelling 09/14/16 09/14/16  Rebecka Apley, MD  escitalopram (LEXAPRO) 10 MG tablet Take 10 mg by mouth daily.    Historical Provider, MD  etonogestrel (NEXPLANON) 68 MG IMPL implant 1 each by Subdermal route once.    Historical Provider, MD  famotidine (PEPCID) 40 MG tablet Take 1 tablet (40 mg total) by mouth every evening. 07/31/16 07/31/17  Rebecka Apley, MD  famotidine (PEPCID) 40 MG tablet Take 1 tablet (40 mg total) by mouth daily. 09/14/16 09/14/17  Rebecka Apley, MD  fluconazole (DIFLUCAN) 150 MG tablet Take 1 tablet (150 mg total) by mouth once. Repeat in 72 hours. Patient not taking: Reported on 07/31/2016 07/09/15   Wallis Bamberg, PA-C  ibuprofen (ADVIL,MOTRIN) 600 MG tablet Take 1 tablet by mouth three times daily with meals 07/03/15   Loleta Rose, MD  nitrofurantoin, macrocrystal-monohydrate, (MACROBID) 100 MG capsule Take 1 capsule (100 mg total) by mouth 2 (two) times daily. Patient not taking: Reported on 07/31/2016 07/14/15   Wallis Bamberg, PA-C  norgestimate-ethinyl estradiol (ORTHO-CYCLEN,SPRINTEC,PREVIFEM) 0.25-35 MG-MCG tablet Take 1 tablet by mouth daily. Patient not taking: Reported on 07/31/2016 07/09/15   Wallis Bamberg, PA-C  penicillin v potassium (VEETID) 500 MG tablet Take 1 tablet (500 mg total) by mouth 3 (three) times daily. Patient not taking: Reported on 07/31/2016 05/03/16   Dahlia Client Muthersbaugh, PA-C  predniSONE (DELTASONE) 20 MG tablet Take 3 tablets (60 mg total) by mouth daily. 07/31/16   Rebecka ApleyAllison P Hopelynn Gartland, MD  predniSONE (DELTASONE) 20 MG tablet Take 3 tablets (60 mg total) by mouth daily. 09/14/16   Rebecka ApleyAllison P Bralee Feldt, MD    Allergies Patient has no known allergies.  No family history on file.  Social History Social History  Substance Use Topics  . Smoking status: Current Every Day Smoker    Packs/day: 0.50    Years: 3.00    Types:  Cigarettes  . Smokeless tobacco: Never Used  . Alcohol use 0.0 oz/week    Review of Systems Constitutional: No fever/chills Eyes: Eyelid swelling ENT: No sore throat. Cardiovascular: Denies chest pain. Respiratory: Difficulty speaking Gastrointestinal: No abdominal pain.  No nausea, no vomiting.  No diarrhea.  No constipation. Genitourinary: Negative for dysuria. Musculoskeletal: Negative for back pain. Skin: Itching and hives Neurological: Negative for headaches, focal weakness or numbness.  10-point ROS otherwise negative.  ____________________________________________   PHYSICAL EXAM:  VITAL SIGNS: ED Triage Vitals  Enc Vitals Group     BP 09/13/16 2309 136/89     Pulse Rate 09/13/16 2309 (!) 105     Resp 09/13/16 2309 18     Temp 09/13/16 2309 98.6 F (37 C)     Temp Source 09/13/16 2309 Oral     SpO2 09/13/16 2309 100 %     Weight 09/13/16 2313 230 lb (104.3 kg)     Height 09/13/16 2313 5\' 3"  (1.6 m)     Head Circumference --      Peak Flow --      Pain Score 09/14/16 0107 0     Pain Loc --      Pain Edu? --      Excl. in GC? --     Constitutional: Alert and oriented. Well appearing and in no acute distress. Eyes: Conjunctivae are normal. PERRL. EOMI. Mild eyelid edema to the right eye Head: Atraumatic. Nose: No congestion/rhinnorhea. Mouth/Throat: Mucous membranes are moist.  Oropharynx non-erythematous. No significant amount of edema. Cardiovascular: Normal rate, regular rhythm. Grossly normal heart sounds.  Good peripheral circulation. Respiratory: Normal respiratory effort.  No retractions. Lungs CTAB. Gastrointestinal: Soft and nontender. No distention. Positive bowel sounds Musculoskeletal: No lower extremity tenderness nor edema.   Neurologic:  Normal speech and language.  Skin:  Skin is warm, dry and intact. No rash noted. Psychiatric: Mood and affect are normal.   ____________________________________________   LABS (all labs ordered are listed,  but only abnormal results are displayed)  Labs Reviewed - No data to display ____________________________________________  EKG  none ____________________________________________  RADIOLOGY  none ____________________________________________   PROCEDURES  Procedure(s) performed: None  Procedures  Critical Care performed: No  ____________________________________________   INITIAL IMPRESSION / ASSESSMENT AND PLAN / ED COURSE  Pertinent labs & imaging results that were available during my care of the patient were reviewed by me and considered in my medical decision making (see chart for details).  This is a 26 year old female who comes into the hospital today with an allergic reaction. The patient did receive some prednisone and Pepcid in triage. She does not have any significant itching right now when she is breathing comfortably. I will give the patient an albuterol treatment to see if that helps with some of the symptoms she feels. I will then reassess the patient.    The patient received an albuterol treatment. She reports that she does feel improved after that treatment. The patient  will be discharged home to follow-up with the acute care clinic.  ____________________________________________   FINAL CLINICAL IMPRESSION(S) / ED DIAGNOSES  Final diagnoses:  Allergic reaction, initial encounter  Itching  Eye swelling      NEW MEDICATIONS STARTED DURING THIS VISIT:  New Prescriptions   EPINEPHRINE 0.3 MG/0.3 ML IJ SOAJ INJECTION    Inject 0.3 mLs (0.3 mg total) into the muscle once. Take if you develop any difficulty breathing, mouth or throat swelling   FAMOTIDINE (PEPCID) 40 MG TABLET    Take 1 tablet (40 mg total) by mouth daily.   PREDNISONE (DELTASONE) 20 MG TABLET    Take 3 tablets (60 mg total) by mouth daily.     Note:  This document was prepared using Dragon voice recognition software and may include unintentional dictation errors.    Rebecka Apley, MD 09/14/16 [phone]

## 2016-09-14 NOTE — Discharge Instructions (Signed)
These follow-up with the acute care clinic and return if you have any worsening symptoms.

## 2016-10-17 ENCOUNTER — Ambulatory Visit (INDEPENDENT_AMBULATORY_CARE_PROVIDER_SITE_OTHER): Payer: 59 | Admitting: Psychology

## 2016-10-17 DIAGNOSIS — F330 Major depressive disorder, recurrent, mild: Secondary | ICD-10-CM | POA: Diagnosis not present

## 2016-11-15 ENCOUNTER — Ambulatory Visit (INDEPENDENT_AMBULATORY_CARE_PROVIDER_SITE_OTHER): Payer: 59 | Admitting: Psychology

## 2016-11-15 DIAGNOSIS — F331 Major depressive disorder, recurrent, moderate: Secondary | ICD-10-CM

## 2016-11-17 ENCOUNTER — Emergency Department
Admission: EM | Admit: 2016-11-17 | Discharge: 2016-11-17 | Disposition: A | Discharge status: Home / Self Care | Payer: 59 | Attending: Emergency Medicine | Admitting: Emergency Medicine

## 2016-11-17 DIAGNOSIS — Z79899 Other long term (current) drug therapy: Secondary | ICD-10-CM | POA: Insufficient documentation

## 2016-11-17 DIAGNOSIS — T7840XA Allergy, unspecified, initial encounter: Secondary | ICD-10-CM | POA: Insufficient documentation

## 2016-11-17 DIAGNOSIS — F17210 Nicotine dependence, cigarettes, uncomplicated: Secondary | ICD-10-CM | POA: Insufficient documentation

## 2016-11-17 DIAGNOSIS — R21 Rash and other nonspecific skin eruption: Secondary | ICD-10-CM | POA: Diagnosis present

## 2016-11-17 MED ORDER — PREDNISONE 10 MG PO TABS: 50.0000 mg | ORAL_TABLET | Freq: Every day | ORAL | 0 refills | Status: AC

## 2016-11-17 MED ORDER — DIPHENHYDRAMINE HCL 50 MG/ML IJ SOLN
50.0000 mg | Freq: Once | INTRAMUSCULAR | Status: AC
  Administered 2016-11-17: 50 mg via INTRAVENOUS
  Filled 2016-11-17: qty 1

## 2016-11-17 MED ORDER — FAMOTIDINE IN NACL 20-0.9 MG/50ML-% IV SOLN
20.0000 mg | Freq: Once | INTRAVENOUS | Status: AC
  Administered 2016-11-17: 20 mg via INTRAVENOUS
  Filled 2016-11-17: qty 50

## 2016-11-17 MED ORDER — METHYLPREDNISOLONE SODIUM SUCC 125 MG IJ SOLR
125.0000 mg | Freq: Once | INTRAMUSCULAR | Status: AC
  Administered 2016-11-17: 125 mg via INTRAVENOUS
  Filled 2016-11-17: qty 2

## 2016-11-17 NOTE — ED Notes (Signed)

## 2016-11-17 NOTE — ED Notes (Signed)
Pt with large amount of undigested food emesis. Pt without airway compromise. Pt states "I always throw up when this happens." call bell at right side.

## 2016-11-17 NOTE — Discharge Instructions (Signed)
Please establish care with a primary care physician in follow-up within 1 week for recheck. He will likely need to be referred on for further allergy testing.  It was a pleasure to take care of you today, and thank you for coming to our emergency department.  If you have any questions or concerns before leaving please ask the nurse to grab me and I'm more than happy to go through your aftercare instructions again.  If you were prescribed any opioid pain medication today such as Norco, Vicodin, Percocet, morphine, hydrocodone, or oxycodone please make sure you do not drive when you are taking this medication as it can alter your ability to drive safely.  If you have any concerns once you are home that you are not improving or are in fact getting worse before you can make it to your follow-up appointment, please do not hesitate to call [phone] and come back for further evaluation.  Merrily BrittleNeil Lucile Hillmann MD  Results for orders placed or performed in visit on 07/09/15  POCT urinalysis dipstick  Result Value Ref Range   Color, UA yellow yellow   Clarity, UA cloudy (A) clear   Glucose, UA negative negative   Bilirubin, UA negative negative   Ketones, POC UA negative negative   Spec Grav, UA 1.025    Blood, UA negative negative   pH, UA 6.0    Protein Ur, POC negative negative   Urobilinogen, UA 0.2    Nitrite, UA Negative Negative   Leukocytes, UA Negative Negative  POCT Microscopic Urinalysis ([facility])  Result Value Ref Range   WBC,UR,HPF,POC Few (A) None WBC/hpf   RBC,UR,HPF,POC None None RBC/hpf   Bacteria Many (A) None, Too numerous to count   Mucus Present (A) Absent   Epithelial Cells, UR Per Microscopy Moderate (A) None, Too numerous to count cells/hpf   Yeast pos   POCT Wet + KOH Prep  Result Value Ref Range   Yeast by KOH Absent Present, Absent   Yeast by wet prep Absent Present, Absent   WBC by wet prep Few None, Few, Too numerous to count   Clue Cells Wet Prep HPF POC None None, Too  numerous to count   Trich by wet prep Absent Present, Absent   Bacteria Wet Prep HPF POC Moderate (A) None, Few, Too numerous to count   Epithelial Cells By Principal FinancialWet Pref ([facility]) Moderate (A) None, Few, Too numerous to count   RBC,UR,HPF,POC None None RBC/hpf  Pap IG w/ reflex to HPV when ASC-U  Result Value Ref Range   Specimen adequacy: SEE NOTE    FINAL DIAGNOSIS: SEE NOTE    Cytotechnologist: SEE NOTE

## 2016-11-17 NOTE — ED Triage Notes (Signed)
Patient c/o allergic reaction. Pt has visible swelling to left eye, itching under breast area, and slight throat tightness. Patient c/o cough, slight dyspnea

## 2016-11-17 NOTE — ED Provider Notes (Signed)
[facility] Emergency Department Provider Note  ____________________________________________   First MD Initiated Contact with Patient 11/17/16 1928     (approximate)  I have reviewed the triage vital signs and the nursing notes.   HISTORY  Chief Complaint Allergic Reaction    HPI Briana Sexton is a 26 y.o. female who presents to the emergency department with 2 hours of ice swelling and itchiness. She is a long-standing history of multiple food allergies and her symptoms all began shortly after eating a McDonald[employer]. This is happened to her when she has eaten at [facility] in the past. She feels itchy and like her eyes are swelling but is not short of breath and is not nauseated. She took 2 Benadryl and Zyrtec at home which improved her symptoms. She comes to the emergency department for further evaluation. She does have an EpiPen but did not use it.   Past Medical History:  Diagnosis Date  . Depression     There are no active problems to display for this patient.   History reviewed. No pertinent surgical history.  Prior to Admission medications   Medication Sig Start Date End Date Taking? Authorizing Provider  cetirizine (ZYRTEC) 10 MG tablet Take 1 tablet (10 mg total) by mouth daily. 07/31/16   Rebecka Apley, MD  diphenhydrAMINE (BENADRYL) 25 mg capsule Take 1 capsule (25 mg total) by mouth every 4 (four) hours as needed. 07/31/16 07/31/17  Rebecka Apley, MD  escitalopram (LEXAPRO) 10 MG tablet Take 10 mg by mouth daily.    Historical Provider, MD  etonogestrel (NEXPLANON) 68 MG IMPL implant 1 each by Subdermal route once.    Historical Provider, MD  famotidine (PEPCID) 40 MG tablet Take 1 tablet (40 mg total) by mouth every evening. 07/31/16 07/31/17  Rebecka Apley, MD  famotidine (PEPCID) 40 MG tablet Take 1 tablet (40 mg total) by mouth daily. 09/14/16 09/14/17  Rebecka Apley, MD  fluconazole (DIFLUCAN) 150 MG tablet Take 1 tablet (150 mg  total) by mouth once. Repeat in 72 hours. Patient not taking: Reported on 07/31/2016 07/09/15   Wallis Bamberg, PA-C  ibuprofen (ADVIL,MOTRIN) 600 MG tablet Take 1 tablet by mouth three times daily with meals 07/03/15   Loleta Rose, MD  nitrofurantoin, macrocrystal-monohydrate, (MACROBID) 100 MG capsule Take 1 capsule (100 mg total) by mouth 2 (two) times daily. Patient not taking: Reported on 07/31/2016 07/14/15   Wallis Bamberg, PA-C  norgestimate-ethinyl estradiol (ORTHO-CYCLEN,SPRINTEC,PREVIFEM) 0.25-35 MG-MCG tablet Take 1 tablet by mouth daily. Patient not taking: Reported on 07/31/2016 07/09/15   Wallis Bamberg, PA-C  penicillin v potassium (VEETID) 500 MG tablet Take 1 tablet (500 mg total) by mouth 3 (three) times daily. Patient not taking: Reported on 07/31/2016 05/03/16   Dahlia Client Muthersbaugh, PA-C  predniSONE (DELTASONE) 10 MG tablet Take 5 tablets (50 mg total) by mouth daily. Please take the prednisone up to 5 days as needed for your swelling.  Save the rest for your next allergic reaction 11/17/16 11/27/16  Merrily Brittle, MD    Allergies Corn-containing products; Dairy aid [lactase]; and Peanut-containing drug products  Family History  Problem Relation Age of Onset  . Hypertension Sister     Social History Social History  Substance Use Topics  . Smoking status: Current Every Day Smoker    Packs/day: 0.50    Years: 3.00    Types: Cigarettes  . Smokeless tobacco: Never Used  . Alcohol use 0.0 oz/week    Review of Systems Constitutional: No  fever/chills Eyes: No visual changes. ENT: No sore throat. Cardiovascular: Denies chest pain. Respiratory: Denies shortness of breath. Gastrointestinal: No abdominal pain.  No nausea, no vomiting.  No diarrhea.  No constipation. Genitourinary: Negative for dysuria. Musculoskeletal: Negative for back pain. Skin:Positive. For rash Neurological: Negative for headaches, focal weakness or numbness.  10-point ROS otherwise  negative.  ____________________________________________   PHYSICAL EXAM:  VITAL SIGNS: ED Triage Vitals  Enc Vitals Group     BP 11/17/16 1920 (!) 144/82     Pulse Rate 11/17/16 1920 (!) 114     Resp 11/17/16 1920 (!) 22     Temp 11/17/16 1920 97.9 F (36.6 C)     Temp Source 11/17/16 1920 Oral     SpO2 11/17/16 1920 98 %     Weight 11/17/16 1921 235 lb (106.6 kg)     Height 11/17/16 1921 5\' 3"  (1.6 m)     Head Circumference --      Peak Flow --      Pain Score 11/17/16 1921 4     Pain Loc --      Pain Edu? --      Excl. in GC? --     Constitutional: Alert and oriented x 4 well appearing nontoxic no diaphoresis speaks in full, clear sentences Eyes: PERRL EOMI.Eyes slightly swollen but not swollen shut Head: Atraumatic. Nose: No congestion/rhinnorhea. Mouth/Throat: No trismus Neck: No stridor.   Cardiovascular: Normal rate, regular rhythm. Grossly normal heart sounds.  Good peripheral circulation. Respiratory: Normal respiratory effort.  No retractions. Lungs CTAB and moving good air Gastrointestinal: Soft nondistended nontender no rebound no guarding no peritonitis no McBurney's tenderness negative Rovsing's no costovertebral tenderness negative Murphy's Musculoskeletal: No lower extremity edema   Neurologic:  Normal speech and language. No gross focal neurologic deficits are appreciated. Skin:  Skin is warm, dry and intact. No rash noted. Psychiatric: Mood and affect are normal. Speech and behavior are normal.    ____________________________________________   DIFFERENTIAL  Allergic reaction, anaphylaxis, angioedema ____________________________________________   LABS (all labs ordered are listed, but only abnormal results are displayed)  Labs Reviewed - No data to  display   __________________________________________  EKG   ____________________________________________  RADIOLOGY   ____________________________________________   PROCEDURES  Procedure(s) performed: no  Procedures  Critical Care performed: no  ____________________________________________   INITIAL IMPRESSION / ASSESSMENT AND PLAN / ED COURSE  Pertinent labs & imaging results that were available during my care of the patient were reviewed by me and considered in my medical decision making (see chart for details).  The patient arrives hemodynamically stable and well appearing aside from periorbital edema. Rashes aren't resolved. She and I both agree that she does not require epinephrine at this point but we will give her intravenous Solu-Medrol Benadryl and Pepcid. She said that steroids normally help when she has allergic reactions and she is down to a single tablet of prednisone at home so she primarily came here to get a refill of her prednisone. Meds were given and she was observed in hour with no recurrence of her symptoms. No indication for epinephrine. She is discharged home in improved and good condition.      ____________________________________________   FINAL CLINICAL IMPRESSION(S) / ED DIAGNOSES  Final diagnoses:  Allergic reaction, initial encounter      NEW MEDICATIONS STARTED DURING THIS VISIT:  Discharge Medication List as of 11/17/2016  8:32 PM       Note:  This document was prepared using Dragon voice  recognition software and may include unintentional dictation errors.     Merrily Brittle, MD 11/18/16 [phone]

## 2016-12-26 ENCOUNTER — Ambulatory Visit (INDEPENDENT_AMBULATORY_CARE_PROVIDER_SITE_OTHER): Payer: 59 | Admitting: Psychology

## 2016-12-26 DIAGNOSIS — F331 Major depressive disorder, recurrent, moderate: Secondary | ICD-10-CM

## 2017-01-01 ENCOUNTER — Emergency Department: Payer: No Typology Code available for payment source

## 2017-01-01 ENCOUNTER — Emergency Department
Admission: EM | Admit: 2017-01-01 | Discharge: 2017-01-01 | Disposition: A | Discharge status: Home / Self Care | Payer: No Typology Code available for payment source | Attending: Emergency Medicine | Admitting: Emergency Medicine

## 2017-01-01 DIAGNOSIS — K8020 Calculus of gallbladder without cholecystitis without obstruction: Secondary | ICD-10-CM

## 2017-01-01 DIAGNOSIS — R1011 Right upper quadrant pain: Secondary | ICD-10-CM | POA: Diagnosis present

## 2017-01-01 DIAGNOSIS — F17210 Nicotine dependence, cigarettes, uncomplicated: Secondary | ICD-10-CM | POA: Diagnosis not present

## 2017-01-01 LAB — COMPREHENSIVE METABOLIC PANEL WITH GFR
ALT: 15 U/L (ref 14–54)
AST: 21 U/L (ref 15–41)
Albumin: 3.8 g/dL (ref 3.5–5.0)
Alkaline Phosphatase: 74 U/L (ref 38–126)
Anion gap: 8 (ref 5–15)
BUN: <5 mg/dL — ABNORMAL LOW (ref 6–20)
CO2: 23 mmol/L (ref 22–32)
Calcium: 9.2 mg/dL (ref 8.9–10.3)
Chloride: 109 mmol/L (ref 101–111)
Creatinine, Ser: 0.7 mg/dL (ref 0.44–1.00)
GFR calc Af Amer: >60 mL/min
GFR calc non Af Amer: >60 mL/min
Glucose, Bld: 109 mg/dL — ABNORMAL HIGH (ref 65–99)
Potassium: 4.2 mmol/L (ref 3.5–5.1)
Sodium: 140 mmol/L (ref 135–145)
Total Bilirubin: 0.6 mg/dL (ref 0.3–1.2)
Total Protein: 7.3 g/dL (ref 6.5–8.1)

## 2017-01-01 LAB — CBC
HCT: 36.4 % (ref 35.0–47.0)
HEMOGLOBIN: 12.4 g/dL (ref 12.0–16.0)
MCH: 28.4 pg (ref 26.0–34.0)
MCHC: 34.2 g/dL (ref 32.0–36.0)
MCV: 83.2 fL (ref 80.0–100.0)
PLATELETS: 350 10*3/uL (ref 150–440)
RBC: 4.38 MIL/uL (ref 3.80–5.20)
RDW: 14.1 % (ref 11.5–14.5)
WBC: 7.4 10*3/uL (ref 3.6–11.0)

## 2017-01-01 LAB — URINALYSIS, COMPLETE (UACMP) WITH MICROSCOPIC
Bacteria, UA: NONE SEEN
Bilirubin Urine: NEGATIVE
Glucose, UA: NEGATIVE mg/dL
Ketones, ur: NEGATIVE mg/dL
Leukocytes, UA: NEGATIVE
Nitrite: NEGATIVE
Protein, ur: NEGATIVE mg/dL
Specific Gravity, Urine: 1.009 (ref 1.005–1.030)
pH: 6 (ref 5.0–8.0)

## 2017-01-01 LAB — POCT PREGNANCY, URINE: Preg Test, Ur: NEGATIVE

## 2017-01-01 LAB — LIPASE, BLOOD: Lipase: 20 U/L (ref 11–51)

## 2017-01-01 MED ORDER — METOCLOPRAMIDE HCL 10 MG PO TABS: 10.0000 mg | ORAL_TABLET | Freq: Three times a day (TID) | ORAL | 0 refills | Status: DC | PRN

## 2017-01-01 MED ORDER — SODIUM CHLORIDE 0.9 % IV BOLUS (SEPSIS)
1000.0000 mL | Freq: Once | INTRAVENOUS | Status: AC
  Administered 2017-01-01: 1000 mL via INTRAVENOUS

## 2017-01-01 MED ORDER — ONDANSETRON HCL 4 MG/2ML IJ SOLN
4.0000 mg | Freq: Once | INTRAMUSCULAR | Status: AC
  Administered 2017-01-01: 4 mg via INTRAVENOUS
  Filled 2017-01-01: qty 2

## 2017-01-01 MED ORDER — PANTOPRAZOLE SODIUM 40 MG PO TBEC
40.0000 mg | DELAYED_RELEASE_TABLET | Freq: Every day | ORAL | Status: DC
  Administered 2017-01-01: 40 mg via ORAL
  Filled 2017-01-01: qty 1

## 2017-01-01 MED ORDER — KETOROLAC TROMETHAMINE 30 MG/ML IJ SOLN
15.0000 mg | Freq: Once | INTRAMUSCULAR | Status: AC
  Administered 2017-01-01: 15 mg via INTRAVENOUS
  Filled 2017-01-01: qty 1

## 2017-01-01 NOTE — ED Notes (Signed)
Patient transported to Ultrasound 

## 2017-01-01 NOTE — ED Triage Notes (Signed)
Pt in with co epigastric pain since tonight with radiation to ruq, denies any n.v.d.

## 2017-01-01 NOTE — ED Notes (Signed)
Pt reports having stomach virus last week and has not fully recovered from symptoms.  Pt states that her BMs have not started to completely firm up at this point and that her appetite has not fully returned.

## 2017-01-01 NOTE — ED Provider Notes (Signed)
[facility] Emergency Department Provider Note  ____________________________________________  Time seen: Approximately 7:37 AM  I have reviewed the triage vital signs and the nursing notes.   HISTORY  Chief Complaint Abdominal Pain   HPI Briana Sexton is a 26 y.o. female no significant past medical history who presents for evaluation of abdominal pain. Patient reports intermittent crampy sharp abdominal pain located in her epigastric and right upper quadrant since 1 AM this morning. Patient thought it was gas and took over-the-counter medications with no relief. I doubt she has no pain as the pain comes and goes. She has had nausea but no vomiting. She reports that she had a GI illness last week and her stools are getting better, no longer diarrhea, but still not solid form. No dysuria or hematuria. No flank pain. No vaginal discharge. LMP a year ago since patient has a Nexplanon implants. No prior abdominal surgeries.  Past Medical History:  Diagnosis Date  . Depression     There are no active problems to display for this patient.   No past surgical history on file.  Prior to Admission medications   Medication Sig Start Date End Date Taking? Authorizing Provider  cetirizine (ZYRTEC) 10 MG tablet Take 1 tablet (10 mg total) by mouth daily. 07/31/16   Rebecka Apley, MD  diphenhydrAMINE (BENADRYL) 25 mg capsule Take 1 capsule (25 mg total) by mouth every 4 (four) hours as needed. 07/31/16 07/31/17  Rebecka Apley, MD  escitalopram (LEXAPRO) 10 MG tablet Take 10 mg by mouth daily.    [provider]  etonogestrel (NEXPLANON) 68 MG IMPL implant 1 each by Subdermal route once.    [provider]  famotidine (PEPCID) 40 MG tablet Take 1 tablet (40 mg total) by mouth every evening. 07/31/16 07/31/17  Rebecka Apley, MD  famotidine (PEPCID) 40 MG tablet Take 1 tablet (40 mg total) by mouth daily. 09/14/16 09/14/17  Rebecka Apley, MD    fluconazole (DIFLUCAN) 150 MG tablet Take 1 tablet (150 mg total) by mouth once. Repeat in 72 hours. Patient not taking: Reported on 07/31/2016 07/09/15   Wallis Bamberg, PA-C  ibuprofen (ADVIL,MOTRIN) 600 MG tablet Take 1 tablet by mouth three times daily with meals 07/03/15   Loleta Rose, MD  metoCLOPramide (REGLAN) 10 MG tablet Take 1 tablet (10 mg total) by mouth every 8 (eight) hours as needed for nausea. 01/01/17 01/04/17  Nita Sickle, MD  nitrofurantoin, macrocrystal-monohydrate, (MACROBID) 100 MG capsule Take 1 capsule (100 mg total) by mouth 2 (two) times daily. Patient not taking: Reported on 07/31/2016 07/14/15   Wallis Bamberg, PA-C  norgestimate-ethinyl estradiol (ORTHO-CYCLEN,SPRINTEC,PREVIFEM) 0.25-35 MG-MCG tablet Take 1 tablet by mouth daily. Patient not taking: Reported on 07/31/2016 07/09/15   Wallis Bamberg, PA-C  penicillin v potassium (VEETID) 500 MG tablet Take 1 tablet (500 mg total) by mouth 3 (three) times daily. Patient not taking: Reported on 07/31/2016 05/03/16   Muthersbaugh, Dahlia Client, PA-C    Allergies Corn-containing products; Dairy aid [lactase]; and Peanut-containing drug products  Family History  Problem Relation Age of Onset  . Hypertension Sister     Social History Social History  Substance Use Topics  . Smoking status: Current Every Day Smoker    Packs/day: 0.50    Years: 3.00    Types: Cigarettes  . Smokeless tobacco: Never Used  . Alcohol use 0.0 oz/week    Review of Systems  Constitutional: Negative for fever. Eyes: Negative for visual changes. ENT: Negative for  sore throat. Neck: No neck pain  Cardiovascular: Negative for chest pain. Respiratory: Negative for shortness of breath. Gastrointestinal: + upper abdominal pain and nausea. No vomiting or diarrhea. Genitourinary: Negative for dysuria. Musculoskeletal: Negative for back pain. Skin: Negative for rash. Neurological: Negative for headaches, weakness or numbness. Psych: No SI or  HI  ____________________________________________   PHYSICAL EXAM:  VITAL SIGNS: ED Triage Vitals  Enc Vitals Group     BP 01/01/17 0636 132/69     Pulse Rate 01/01/17 0634 91     Resp 01/01/17 0634 18     Temp 01/01/17 0634 98.3 F (36.8 C)     Temp Source 01/01/17 0634 Oral     SpO2 01/01/17 0634 98 %     Weight 01/01/17 0634 260 lb (117.9 kg)     Height 01/01/17 0634 5\' 3"  (1.6 m)     Head Circumference --      Peak Flow --      Pain Score 01/01/17 0633 8     Pain Loc --      Pain Edu? --      Excl. in GC? --     Constitutional: Alert and oriented. Well appearing and in no apparent distress. HEENT:      Head: Normocephalic and atraumatic.         Eyes: Conjunctivae are normal. Sclera is non-icteric. EOMI. PERRL      Mouth/Throat: Mucous membranes are moist.       Neck: Supple with no signs of meningismus. Cardiovascular: Regular rate and rhythm. No murmurs, gallops, or rubs. 2+ symmetrical distal pulses are present in all extremities. No JVD. Respiratory: Normal respiratory effort. Lungs are clear to auscultation bilaterally. No wheezes, crackles, or rhonchi.  Gastrointestinal: Soft, mild epigastric and right upper quadrant tenderness with negative Murphy sign, and non distended with positive bowel sounds. No rebound or guarding. Genitourinary: No CVA tenderness. Musculoskeletal: Nontender with normal range of motion in all extremities. No edema, cyanosis, or erythema of extremities. Neurologic: Normal speech and language. Face is symmetric. Moving all extremities. No gross focal neurologic deficits are appreciated. Skin: Skin is warm, dry and intact. No rash noted. Psychiatric: Mood and affect are normal. Speech and behavior are normal.  ____________________________________________   LABS (all labs ordered are listed, but only abnormal results are displayed)  Labs Reviewed  COMPREHENSIVE METABOLIC PANEL - Abnormal; Notable for the following:       Result Value    Glucose, Bld 109 (*)    BUN <5 (*)    All other components within normal limits  URINALYSIS, COMPLETE (UACMP) WITH MICROSCOPIC - Abnormal; Notable for the following:    Color, Urine YELLOW (*)    APPearance CLEAR (*)    Hgb urine dipstick SMALL (*)    Squamous Epithelial / LPF 0-5 (*)    All other components within normal limits  LIPASE, BLOOD  CBC  POC URINE PREG, ED  POCT PREGNANCY, URINE   ____________________________________________  EKG  none ____________________________________________  RADIOLOGY  RUQ [place];  Cholelithiasis. No sonographic evidence of acute cholecystitis ____________________________________________   PROCEDURES  Procedure(s) performed: None Procedures Critical Care performed:  None ____________________________________________   INITIAL IMPRESSION / ASSESSMENT AND PLAN / ED COURSE  26 y.o. female no significant past medical history who presents for evaluation of intermittent cramping epigastric/right upper quadrant abdominal pain. No pain at this time. Patient is well-appearing, in no distress, has normal vital signs, she has mild epigastric and right upper quadrant tenderness with no  rebound or guarding, negative Murphy sign. Differential diagnoses including gastritis versus peptic ulcer disease versus gallbladder pathology versus pancreatitis versus gas pain. Plan for U preg, UA, CBC, CMP, lipase. We'll give IV fluids, Zofran, and Toradol.    _________________________ 10:08 AM on 01/01/2017 -----------------------------------------  Right upper quadrant ultrasound showing cholelithiasis with no evidence of cholecystitis. Blood work with no acute findings. Patient is tolerating by mouth. No longer having any abdominal pain. No evidence of cholecystitis at this time. We'll discharge home with Zofran and referral to surgery. Discussed the patient is to return to the emergency room if the pain returns and persists for more than an hour as she could be  developing cholecystitis.  Pertinent labs & imaging results that were available during my care of the patient were reviewed by me and considered in my medical decision making (see chart for details).    ____________________________________________   FINAL CLINICAL IMPRESSION(S) / ED DIAGNOSES  Final diagnoses:  RUQ abdominal pain  Calculus of gallbladder without cholecystitis without obstruction      NEW MEDICATIONS STARTED DURING THIS VISIT:  New Prescriptions   METOCLOPRAMIDE (REGLAN) 10 MG TABLET    Take 1 tablet (10 mg total) by mouth every 8 (eight) hours as needed for nausea.     Note:  This document was prepared using Dragon voice recognition software and may include unintentional dictation errors.    Don PerkingVeronese, [place], MD 01/01/17 1009

## 2017-01-04 ENCOUNTER — Ambulatory Visit (INDEPENDENT_AMBULATORY_CARE_PROVIDER_SITE_OTHER): Payer: No Typology Code available for payment source | Admitting: Surgery

## 2017-01-04 ENCOUNTER — Encounter: Payer: Self-pay | Admitting: Surgery

## 2017-01-04 VITALS — BP 115/77 | HR 96 | Temp 97.9°F | Ht 63.0 in | Wt 269.0 lb

## 2017-01-04 DIAGNOSIS — K8020 Calculus of gallbladder without cholecystitis without obstruction: Secondary | ICD-10-CM

## 2017-01-04 NOTE — Progress Notes (Signed)
Surgical Clinic History and Physical  No referring provider defined for this encounter. [facility] Dr[facility] Don PerkingVeronese (01/01/2017)  HISTORY OF PRESENT ILLNESS (HPI):  26 y.o. female presents for evaluation of post-prandial RUQ > epigastric abdominal pain, for which she recently [facility] ED on 01/01/2017. Patient reports she ate fried chicken for a late dinner Monday and felt well when she went to bed until being awaken from sleep ~1 am with severe RUQ > epigastric abdominal pain that she attributed to gas. When Alkaseltzer and Gas-x didn't provide relief, patient sought ED evaluation, where RUQ abdominal ultrasound was performed. Patient reports experiencing similar lesser pain prior to the episode for which she presented to the ED, but she says she didn't then pay much attention to what she'd eaten. She's also experienced similar pain again this past Wednesday evening after eating meatloaf for dinner. She previously experienced heartburn ~1-1.5 years ago, but denies any recent GERD, fever/chills, CP, or SOB. 2 weeks ago, patient describes having experienced a "GI bug" with nausea and diarrhea that have both since resolved.  PAST MEDICAL HISTORY (PMH):  Past Medical History:  Diagnosis Date  . Abnormal uterine bleeding   . Depression      PAST SURGICAL HISTORY (PSH):  No past surgical history on file.   MEDICATIONS:  Prior to Admission medications   Medication Sig Start Date End Date Taking? Authorizing Provider  cetirizine (ZYRTEC) 10 MG tablet Take 1 tablet (10 mg total) by mouth daily. 07/31/16   Rebecka ApleyWebster, Allison P, MD  diphenhydrAMINE (BENADRYL) 25 mg capsule Take 1 capsule (25 mg total) by mouth every 4 (four) hours as needed. 07/31/16 07/31/17  Rebecka ApleyWebster, Allison P, MD  escitalopram (LEXAPRO) 10 MG tablet Take 10 mg by mouth daily.    [provider]  etonogestrel (NEXPLANON) 68 MG IMPL implant 1 each by Subdermal route once.    [provider]   famotidine (PEPCID) 40 MG tablet Take 1 tablet (40 mg total) by mouth every evening. 07/31/16 07/31/17  Rebecka ApleyWebster, Allison P, MD  famotidine (PEPCID) 40 MG tablet Take 1 tablet (40 mg total) by mouth daily. 09/14/16 09/14/17  Rebecka ApleyWebster, Allison P, MD  fluconazole (DIFLUCAN) 150 MG tablet Take 1 tablet (150 mg total) by mouth once. Repeat in 72 hours. Patient not taking: Reported on 07/31/2016 07/09/15   Wallis BambergMani, Mario, PA-C  ibuprofen (ADVIL,MOTRIN) 600 MG tablet Take 1 tablet by mouth three times daily with meals 07/03/15   Loleta RoseForbach, Cory, MD  metoCLOPramide (REGLAN) 10 MG tablet Take 1 tablet (10 mg total) by mouth every 8 (eight) hours as needed for nausea. 01/01/17 01/04/17  Nita SickleVeronese, [place], MD  nitrofurantoin, macrocrystal-monohydrate, (MACROBID) 100 MG capsule Take 1 capsule (100 mg total) by mouth 2 (two) times daily. Patient not taking: Reported on 07/31/2016 07/14/15   Wallis BambergMani, Mario, PA-C  norgestimate-ethinyl estradiol (ORTHO-CYCLEN,SPRINTEC,PREVIFEM) 0.25-35 MG-MCG tablet Take 1 tablet by mouth daily. Patient not taking: Reported on 07/31/2016 07/09/15   Wallis BambergMani, Mario, PA-C  ondansetron (ZOFRAN) 4 MG tablet Take 4 mg by mouth daily as needed. 12/10/16   [provider]  penicillin v potassium (VEETID) 500 MG tablet Take 1 tablet (500 mg total) by mouth 3 (three) times daily. Patient not taking: Reported on 07/31/2016 05/03/16   Muthersbaugh, Dahlia ClientHannah, PA-C     ALLERGIES:  Allergies  Allergen Reactions  . Corn-Containing Products   . Dairy Aid [Lactase]   . Peanut-Containing Drug Products      SOCIAL HISTORY:  Social History  Social History  . Marital status: Single    Spouse name: N/A  . Number of children: N/A  . Years of education: N/A   Occupational History  . Not on file.   Social History Main Topics  . Smoking status: Current Every Day Smoker    Packs/day: 0.50    Years: 3.00    Types: Cigarettes  . Smokeless tobacco: Never Used  . Alcohol use 0.0 oz/week  . Drug use: Yes     Types: Marijuana  . Sexual activity: Not on file   Other Topics Concern  . Not on file   Social History Narrative  . No narrative on file    The patient currently resides (home / rehab facility / nursing home): Home  The patient normally is (ambulatory / bedbound) : Ambulatory   FAMILY HISTORY:  Family History  Problem Relation Age of Onset  . Hypertension Sister   . Diabetes Maternal Uncle   . Thyroid disease Mother     Otherwise negative/non-contributory.  REVIEW OF SYSTEMS:  Constitutional: denies any other weight loss, fever, chills, or sweats  Eyes: denies any other vision changes, history of eye injury  ENT: denies sore throat, hearing problems  Respiratory: denies shortness of breath, wheezing  Cardiovascular: denies chest pain, palpitations  Gastrointestinal: abdominal pain, N/V, and bowel function as per HPI Musculoskeletal: denies any other joint pains or cramps  Skin: Denies any other rashes or skin discolorations  Neurological: denies any other headache, dizziness, weakness  Psychiatric: Denies any other depression, anxiety   All other review of systems were otherwise negative   VITAL SIGNS:  BP 115/77   Pulse 96   Temp 97.9 F (36.6 C) (Oral)   Ht 5\' 3"  (1.6 m)   Wt 269 lb (122 kg)   BMI 47.65 kg/m   PHYSICAL EXAM:  Constitutional:  -- Obese body habitus  -- Awake, alert, and oriented x3  Eyes:  -- Pupils equally round and reactive to light  -- No scleral icterus  Ear, nose, throat:  -- No jugular venous distension -- No nasal drainage, bleeding Pulmonary:  -- No crackles  -- Equal breath sounds bilaterally -- Breathing non-labored at rest Cardiovascular:  -- S1, S2 present  -- No pericardial rubs  Gastrointestinal:  -- Abdomen soft, obese, nontender, nondistended, no guarding/rebound  -- No abdominal masses appreciated, pulsatile or otherwise  Musculoskeletal and Integumentary:  -- Wounds or skin discoloration: None -- Extremities:  B/L UE and LE FROM, hands and feet warm, no edema  Neurologic:  -- Motor function: Intact and symmetric -- Sensation: Intact and symmetric  Labs:  CBC:  Lab Results  Component Value Date   WBC 7.4 01/01/2017   RBC 4.38 01/01/2017   CMP Latest Ref Rng & Units 01/01/2017  Glucose 65 - 99 mg/dL 811(B109(H)  BUN 6 - 20 mg/dL <1(Y<5(L)  Creatinine 7.820.44 - 1.00 mg/dL 9.560.70  Sodium 213135 - 086145 mmol/L 140  Potassium 3.5 - 5.1 mmol/L 4.2  Chloride 101 - 111 mmol/L 109  CO2 22 - 32 mmol/L 23  Calcium 8.9 - 10.3 mg/dL 9.2  Total Protein 6.5 - 8.1 g/dL 7.3  Total Bilirubin 0.3 - 1.2 mg/dL 0.6  Alkaline Phos 38 - 126 U/L 74  AST 15 - 41 U/L 21  ALT 14 - 54 U/L 15    Imaging studies:  Limited RUQ Abdominal Ultrasound (01/01/2017) Gallbladder: Multiple mobile gallstones, the largest 1.6 cm. No wall thickening or sonographic Murphy's. Common bile duct diameter measures  4 mm (normal caliber).  Assessment/Plan:  26 y.o. female with post-prandial RUQ abdominal pain consistent with symptomatic cholelithiasis, complicated by co-morbidities including morbid obesity (BMI 48), abnormal uterine bleeding, and major depression disorder.   - avoid foods higher in fat content (meats, cheese/dairy, and fried), prefer whole grains, vegetables, and fruit   - all risks, benefits, and alternatives to laparoscopic cholecystectomy were discussed with the patient and her friend/partner present for her visit, all of their patient's and her friend/partner's questions were answered to their expressed satisfaction, patient expresses she wishes to proceed, and informed consent was obtained accordingly  - will plan for elective outpatient laparoscopic cholecystectomy Wednesday, 6/6  - post-surgical follow-up 2 weeks after above planned procedure  All of the above recommendations were discussed with the patient and her friend/partner present for her visit, and all of patient's and friend/partner's questions were answered to their  expressed satisfaction.  Thank you for the opportunity to participate in this patient's care.  -- Scherrie Gerlach Earlene Plater, MD, RPVI [place]: [facility] - Partnering for exceptional care. Office: [phone]

## 2017-01-04 NOTE — Patient Instructions (Addendum)
We have scheduled your laparoscopic surgery with Dr. Earlene Plateravis for 01/30/17.  Please see your blue sheet for further instructions.

## 2017-01-04 NOTE — Addendum Note (Signed)
Addended by: Chrisandra NettersAVIS, Tamica Covell E on: 01/04/2017 12:15 PM   Modules accepted: Orders, SmartSet

## 2017-01-07 ENCOUNTER — Telehealth: Payer: Self-pay

## 2017-01-07 NOTE — Telephone Encounter (Signed)
Patient has been advised of Surgery Date as well as Pre-Admission appointment date, time, and location.  Surgery Date: 01/30/17  Pre-admit Appointment: 01/22/17 from 9a-1p  Patient has been advised to call [phone] the day before surgery between 1-3pm to obtain arrival time.

## 2017-01-08 NOTE — Telephone Encounter (Signed)
No authorization required for CPT code [id number] per Casimiro NeedleMichael B. [facility].

## 2017-01-09 ENCOUNTER — Telehealth: Payer: Self-pay

## 2017-01-09 NOTE — Telephone Encounter (Signed)
Patient is having surgery on 01/30/2017 and would like to know how much she has to pay before her surgery. Please call patient and advice.

## 2017-01-10 ENCOUNTER — Ambulatory Visit: Payer: Self-pay | Admitting: General Surgery

## 2017-01-10 NOTE — Telephone Encounter (Signed)
I have called patient back to go over benefits for Surgery with Dr Earlene Plateravis on 01/30/17. No answer. I have left a message on viocemail.   Patient will be responsible for 957.53 for physician charges.  Patient has a 2850.00 deductible and a 30% co-insurance with [employer]. [employer] will pay only 300.00 towards the claim once it is received and this does not apply to patient's responsibility.

## 2017-01-14 ENCOUNTER — Ambulatory Visit (INDEPENDENT_AMBULATORY_CARE_PROVIDER_SITE_OTHER): Payer: 59 | Admitting: Psychology

## 2017-01-14 DIAGNOSIS — F331 Major depressive disorder, recurrent, moderate: Secondary | ICD-10-CM | POA: Diagnosis not present

## 2017-01-22 ENCOUNTER — Encounter
Admission: RE | Admit: 2017-01-22 | Discharge: 2017-01-22 | Disposition: A | Discharge status: Home / Self Care | Payer: 59 | Source: Ambulatory Visit | Attending: Surgery | Admitting: Surgery

## 2017-01-22 HISTORY — DX: Prediabetes: R73.03

## 2017-01-22 HISTORY — DX: Headache: R51

## 2017-01-22 HISTORY — DX: Headache, unspecified: R51.9

## 2017-01-22 HISTORY — DX: Gastro-esophageal reflux disease without esophagitis: K21.9

## 2017-01-22 NOTE — Patient Instructions (Signed)
  Your procedure is scheduled on: 01-30-17  Report to Same Day Surgery 2nd floor medical mall [facility](Medical Mall Entrance-take elevator on left to 2nd floor.  Check in with surgery information desk.) To find out your arrival time please call [phone] between 1PM - 3PM on 01-29-17  Remember: Instructions that are not followed completely may result in serious medical risk, up to and including death, or upon the discretion of your surgeon and anesthesiologist your surgery may need to be rescheduled.    _x___ 1. Do not eat food or drink liquids after midnight. No gum chewing or hard candies.     __x__ 2. No Alcohol for 24 hours before or after surgery.   __x__3. No Smoking for 24 prior to surgery.   ____  4. Bring all medications with you on the day of surgery if instructed.    __x__ 5. Notify your doctor if there is any change in your medical condition     (cold, fever, infections).     Do not wear jewelry, make-up, hairpins, clips or nail polish.  Do not wear lotions, powders, or perfumes. You may wear deodorant.  Do not shave 48 hours prior to surgery. Men may shave face and neck.  Do not bring valuables to the hospital.    [facility] is not responsible for any belongings or valuables.               Contacts, dentures or bridgework may not be worn into surgery.  Leave your suitcase in the car. After surgery it may be brought to your room.  For patients admitted to the hospital, discharge time is determined by your                       treatment team.   Patients discharged the day of surgery will not be allowed to drive home.  You will need someone to drive you home and stay with you the night of your procedure.    Please read over the following fact sheets that you were given:   [facility] Preparing for Surgery and or MRSA Information   _x___ Take anti-hypertensive (unless it includes a diuretic), cardiac, seizure, asthma,     anti-reflux and psychiatric medicines. These include:  1.  PEPCID  2. TAKE A PEPCID THE NIGHT BEFORE SURGERY  3.  4.  5.  6.  ____Fleets enema or Magnesium Citrate as directed.   ____ Use CHG Soap or sage wipes as directed on instruction sheet   ____ Use inhalers on the day of surgery and bring to hospital day of surgery  ____ Stop Metformin and Janumet 2 days prior to surgery.    ____ Take 1/2 of usual insulin dose the night before surgery and none on the morning surgery.   ____ Follow recommendations from Cardiologist, Pulmonologist or PCP regarding stopping Aspirin, Coumadin, Pllavix ,Eliquis, Effient, or Pradaxa, and Pletal.  X____Stop Anti-inflammatories such as Advil, Aleve, IBUPROFEN, Motrin, Naproxen, Naprosyn, Goodies powders or aspirin products NOW-OK to take Tylenol    ____ Stop supplements until after surgery.    ____ Bring C-Pap to the hospital.

## 2017-01-29 ENCOUNTER — Encounter: Payer: Self-pay | Admitting: *Deleted

## 2017-01-30 ENCOUNTER — Ambulatory Visit
Admission: RE | Admit: 2017-01-30 | Discharge: 2017-01-30 | Disposition: A | Discharge status: Home / Self Care | Payer: 59 | Source: Ambulatory Visit | Attending: Surgery | Admitting: Surgery

## 2017-01-30 ENCOUNTER — Encounter
Admission: RE | Disposition: A | Discharge status: Home / Self Care | Payer: Self-pay | Source: Ambulatory Visit | Attending: Surgery

## 2017-01-30 ENCOUNTER — Encounter: Payer: Self-pay | Admitting: *Deleted

## 2017-01-30 ENCOUNTER — Ambulatory Visit: Payer: 59 | Admitting: Certified Registered"

## 2017-01-30 DIAGNOSIS — Z79899 Other long term (current) drug therapy: Secondary | ICD-10-CM | POA: Insufficient documentation

## 2017-01-30 DIAGNOSIS — K219 Gastro-esophageal reflux disease without esophagitis: Secondary | ICD-10-CM | POA: Insufficient documentation

## 2017-01-30 DIAGNOSIS — Z6841 Body Mass Index (BMI) 40.0 and over, adult: Secondary | ICD-10-CM | POA: Diagnosis not present

## 2017-01-30 DIAGNOSIS — K8020 Calculus of gallbladder without cholecystitis without obstruction: Secondary | ICD-10-CM

## 2017-01-30 DIAGNOSIS — Z791 Long term (current) use of non-steroidal anti-inflammatories (NSAID): Secondary | ICD-10-CM | POA: Diagnosis not present

## 2017-01-30 DIAGNOSIS — F17210 Nicotine dependence, cigarettes, uncomplicated: Secondary | ICD-10-CM | POA: Diagnosis not present

## 2017-01-30 DIAGNOSIS — R1013 Epigastric pain: Secondary | ICD-10-CM | POA: Diagnosis present

## 2017-01-30 DIAGNOSIS — F329 Major depressive disorder, single episode, unspecified: Secondary | ICD-10-CM | POA: Insufficient documentation

## 2017-01-30 DIAGNOSIS — K8010 Calculus of gallbladder with chronic cholecystitis without obstruction: Secondary | ICD-10-CM | POA: Diagnosis not present

## 2017-01-30 HISTORY — PX: CHOLECYSTECTOMY: SHX55

## 2017-01-30 LAB — URINE DRUG SCREEN, QUALITATIVE (ARMC ONLY)
AMPHETAMINES, UR SCREEN: NOT DETECTED
BENZODIAZEPINE, UR SCRN: NOT DETECTED
Barbiturates, Ur Screen: NOT DETECTED
CANNABINOID 50 NG, UR ~~LOC~~: NOT DETECTED
Cocaine Metabolite,Ur ~~LOC~~: NOT DETECTED
MDMA (ECSTASY) UR SCREEN: NOT DETECTED
Methadone Scn, Ur: NOT DETECTED
Opiate, Ur Screen: NOT DETECTED
PHENCYCLIDINE (PCP) UR S: NOT DETECTED
TRICYCLIC, UR SCREEN: NOT DETECTED

## 2017-01-30 LAB — POCT PREGNANCY, URINE: Preg Test, Ur: NEGATIVE

## 2017-01-30 SURGERY — LAPAROSCOPIC CHOLECYSTECTOMY
Anesthesia: General | Wound class: Clean Contaminated

## 2017-01-30 MED ORDER — PROPOFOL 10 MG/ML IV BOLUS
INTRAVENOUS | Status: AC
  Filled 2017-01-30: qty 20

## 2017-01-30 MED ORDER — SUGAMMADEX SODIUM 200 MG/2ML IV SOLN
INTRAVENOUS | Status: DC | PRN
  Administered 2017-01-30: 250 mg via INTRAVENOUS

## 2017-01-30 MED ORDER — ACETAMINOPHEN 10 MG/ML IV SOLN
INTRAVENOUS | Status: AC
  Filled 2017-01-30: qty 100

## 2017-01-30 MED ORDER — OXYCODONE HCL 5 MG PO TABS
5.0000 mg | ORAL_TABLET | ORAL | Status: DC | PRN
  Administered 2017-01-30: 5 mg via ORAL

## 2017-01-30 MED ORDER — FAMOTIDINE 20 MG PO TABS
ORAL_TABLET | ORAL | Status: AC
  Filled 2017-01-30: qty 1

## 2017-01-30 MED ORDER — LIDOCAINE HCL (PF) 1 % IJ SOLN
INTRAMUSCULAR | Status: AC
  Filled 2017-01-30: qty 30

## 2017-01-30 MED ORDER — KETOROLAC TROMETHAMINE 30 MG/ML IJ SOLN
INTRAMUSCULAR | Status: AC
  Filled 2017-01-30: qty 1

## 2017-01-30 MED ORDER — FENTANYL CITRATE (PF) 100 MCG/2ML IJ SOLN
INTRAMUSCULAR | Status: AC
  Filled 2017-01-30: qty 2

## 2017-01-30 MED ORDER — KETOROLAC TROMETHAMINE 30 MG/ML IJ SOLN
INTRAMUSCULAR | Status: DC | PRN
  Administered 2017-01-30: 30 mg via INTRAVENOUS

## 2017-01-30 MED ORDER — CEFAZOLIN SODIUM 10 G IJ SOLR
3.0000 g | INTRAMUSCULAR | Status: AC
  Administered 2017-01-30: 3 g via INTRAVENOUS
  Filled 2017-01-30: qty 3000

## 2017-01-30 MED ORDER — SUGAMMADEX SODIUM 500 MG/5ML IV SOLN
INTRAVENOUS | Status: AC
  Filled 2017-01-30: qty 5

## 2017-01-30 MED ORDER — BUPIVACAINE HCL (PF) 0.5 % IJ SOLN
INTRAMUSCULAR | Status: AC
  Filled 2017-01-30: qty 30

## 2017-01-30 MED ORDER — PHENYLEPHRINE HCL 10 MG/ML IJ SOLN
INTRAMUSCULAR | Status: DC | PRN
  Administered 2017-01-30: 100 ug via INTRAVENOUS

## 2017-01-30 MED ORDER — ONDANSETRON HCL 4 MG/2ML IJ SOLN
INTRAMUSCULAR | Status: DC | PRN
  Administered 2017-01-30: 4 mg via INTRAVENOUS

## 2017-01-30 MED ORDER — MIDAZOLAM HCL 2 MG/2ML IJ SOLN
INTRAMUSCULAR | Status: DC | PRN
  Administered 2017-01-30: 2 mg via INTRAVENOUS

## 2017-01-30 MED ORDER — SUCCINYLCHOLINE CHLORIDE 20 MG/ML IJ SOLN
INTRAMUSCULAR | Status: AC
  Filled 2017-01-30: qty 1

## 2017-01-30 MED ORDER — FENTANYL CITRATE (PF) 100 MCG/2ML IJ SOLN
INTRAMUSCULAR | Status: DC | PRN
Start: 2017-01-30 — End: 2017-01-30
  Administered 2017-01-30: 50 ug via INTRAVENOUS
  Administered 2017-01-30: 100 ug via INTRAVENOUS
  Administered 2017-01-30 (×2): 50 ug via INTRAVENOUS

## 2017-01-30 MED ORDER — LIDOCAINE HCL (CARDIAC) 20 MG/ML IV SOLN
INTRAVENOUS | Status: DC | PRN
  Administered 2017-01-30: 50 mg via INTRAVENOUS

## 2017-01-30 MED ORDER — LIDOCAINE HCL 1 % IJ SOLN
INTRAMUSCULAR | Status: DC | PRN
  Administered 2017-01-30: 18 mL

## 2017-01-30 MED ORDER — DEXAMETHASONE SODIUM PHOSPHATE 10 MG/ML IJ SOLN
INTRAMUSCULAR | Status: AC
  Filled 2017-01-30: qty 1

## 2017-01-30 MED ORDER — FENTANYL CITRATE (PF) 100 MCG/2ML IJ SOLN
INTRAMUSCULAR | Status: AC
  Administered 2017-01-30: 100 ug
  Filled 2017-01-30: qty 2

## 2017-01-30 MED ORDER — SUCCINYLCHOLINE CHLORIDE 20 MG/ML IJ SOLN
INTRAMUSCULAR | Status: DC | PRN
  Administered 2017-01-30: 100 mg via INTRAVENOUS

## 2017-01-30 MED ORDER — ONDANSETRON HCL 4 MG/2ML IJ SOLN
INTRAMUSCULAR | Status: AC
  Filled 2017-01-30: qty 2

## 2017-01-30 MED ORDER — ACETAMINOPHEN 10 MG/ML IV SOLN
INTRAVENOUS | Status: DC | PRN
  Administered 2017-01-30: 1000 mg via INTRAVENOUS

## 2017-01-30 MED ORDER — OXYCODONE HCL 5 MG PO TABS: 5.0000 mg | ORAL_TABLET | ORAL | 0 refills | Status: DC | PRN

## 2017-01-30 MED ORDER — PROPOFOL 10 MG/ML IV BOLUS
INTRAVENOUS | Status: DC | PRN
  Administered 2017-01-30: 160 mg via INTRAVENOUS

## 2017-01-30 MED ORDER — LACTATED RINGERS IV SOLN
INTRAVENOUS | Status: DC
  Administered 2017-01-30: 09:00:00 via INTRAVENOUS

## 2017-01-30 MED ORDER — ROCURONIUM BROMIDE 100 MG/10ML IV SOLN
INTRAVENOUS | Status: DC | PRN
  Administered 2017-01-30 (×2): 10 mg via INTRAVENOUS
  Administered 2017-01-30: 45 mg via INTRAVENOUS
  Administered 2017-01-30: 5 mg via INTRAVENOUS

## 2017-01-30 MED ORDER — ROCURONIUM BROMIDE 50 MG/5ML IV SOLN
INTRAVENOUS | Status: AC
  Filled 2017-01-30: qty 1

## 2017-01-30 MED ORDER — CHLORHEXIDINE GLUCONATE CLOTH 2 % EX PADS: 6.0000 | MEDICATED_PAD | Freq: Once | CUTANEOUS | Status: DC

## 2017-01-30 MED ORDER — MIDAZOLAM HCL 2 MG/2ML IJ SOLN
INTRAMUSCULAR | Status: AC
Start: 2017-01-30 — End: 2017-01-30
  Filled 2017-01-30: qty 2

## 2017-01-30 MED ORDER — OXYCODONE HCL 5 MG PO TABS
ORAL_TABLET | ORAL | Status: AC
  Filled 2017-01-30: qty 1

## 2017-01-30 MED ORDER — DEXAMETHASONE SODIUM PHOSPHATE 10 MG/ML IJ SOLN
INTRAMUSCULAR | Status: DC | PRN
  Administered 2017-01-30: 4 mg via INTRAVENOUS

## 2017-01-30 SURGICAL SUPPLY — 38 items
APPLIER CLIP LAPSCP 10X32 DD (CLIP) ×3 IMPLANT
APPLIER CLIP ROT 10 11.4 M/L (STAPLE) ×3
CATH REDDICK CHOLANGI 4FR 50CM (CATHETERS) IMPLANT
CHLORAPREP W/TINT 26ML (MISCELLANEOUS) ×3 IMPLANT
CLIP APPLIE ROT 10 11.4 M/L (STAPLE) ×1 IMPLANT
DECANTER SPIKE VIAL GLASS SM (MISCELLANEOUS) ×3 IMPLANT
DEFOGGER SCOPE WARMER CLEARIFY (MISCELLANEOUS) ×3 IMPLANT
DERMABOND ADVANCED (GAUZE/BANDAGES/DRESSINGS) ×2
DERMABOND ADVANCED .7 DNX12 (GAUZE/BANDAGES/DRESSINGS) ×1 IMPLANT
DRESSING SURGICEL FIBRLLR 1X2 (HEMOSTASIS) IMPLANT
DRSG SURGICEL FIBRILLAR 1X2 (HEMOSTASIS)
ELECT REM PT RETURN 9FT ADLT (ELECTROSURGICAL) ×3
ELECTRODE REM PT RTRN 9FT ADLT (ELECTROSURGICAL) ×1 IMPLANT
GLOVE BIO SURGEON STRL SZ7 (GLOVE) ×3 IMPLANT
GLOVE BIOGEL PI IND STRL 7.5 (GLOVE) ×1 IMPLANT
GLOVE BIOGEL PI INDICATOR 7.5 (GLOVE) ×2
GOWN STRL REUS W/ TWL LRG LVL3 (GOWN DISPOSABLE) ×3 IMPLANT
GOWN STRL REUS W/TWL LRG LVL3 (GOWN DISPOSABLE) ×6
GRASPER SUT TROCAR 14GX15 (MISCELLANEOUS) ×3 IMPLANT
IRRIGATION STRYKERFLOW (MISCELLANEOUS) ×1 IMPLANT
IRRIGATOR STRYKERFLOW (MISCELLANEOUS) ×3
IV NS 1000ML (IV SOLUTION) ×2
IV NS 1000ML BAXH (IV SOLUTION) ×1 IMPLANT
KIT RM TURNOVER STRD PROC AR (KITS) ×3 IMPLANT
NEEDLE HYPO 22GX1.5 SAFETY (NEEDLE) ×3 IMPLANT
NEEDLE INSUFFLATION 14GA 120MM (NEEDLE) ×3 IMPLANT
NS IRRIG 1000ML POUR BTL (IV SOLUTION) ×3 IMPLANT
PACK LAP CHOLECYSTECTOMY (MISCELLANEOUS) ×3 IMPLANT
POUCH ENDO CATCH 10MM SPEC (MISCELLANEOUS) ×3 IMPLANT
SCISSORS METZENBAUM CVD 33 (INSTRUMENTS) ×3 IMPLANT
SLEEVE ENDOPATH XCEL 5M (ENDOMECHANICALS) ×6 IMPLANT
SUT MNCRL AB 4-0 PS2 18 (SUTURE) ×3 IMPLANT
SUT VICRYL 0 UR6 27IN ABS (SUTURE) ×3 IMPLANT
SUT VICRYL AB 3-0 FS1 BRD 27IN (SUTURE) ×3 IMPLANT
SYR 20CC LL (SYRINGE) IMPLANT
TROCAR XCEL NON-BLD 11X100MML (ENDOMECHANICALS) ×3 IMPLANT
TROCAR XCEL NON-BLD 5MMX100MML (ENDOMECHANICALS) ×3 IMPLANT
TUBING INSUFFLATION (TUBING) ×3 IMPLANT

## 2017-01-30 NOTE — H&P (View-Only) (Signed)
Surgical Clinic History and Physical  No referring provider defined for this encounter. [facility] physician Dr. Don PerkingVeronese (01/01/2017)  HISTORY OF PRESENT ILLNESS (HPI):  26 y.o. female presents for evaluation of post-prandial RUQ > epigastric abdominal pain, for which she recently presented to [facility] ED on 01/01/2017. Patient reports she ate fried chicken for a late dinner Monday and felt well when she went to bed until being awaken from sleep ~1 am with severe RUQ > epigastric abdominal pain that she attributed to gas. When Alkaseltzer and Gas-x didn't provide relief, patient sought ED evaluation, where RUQ abdominal ultrasound was performed. Patient reports experiencing similar lesser pain prior to the episode for which she presented to the ED, but she says she didn't then pay much attention to what she'd eaten. She's also experienced similar pain again this past Wednesday evening after eating meatloaf for dinner. She previously experienced heartburn ~1-1.5 years ago, but denies any recent GERD, fever/chills, CP, or SOB. 2 weeks ago, patient describes having experienced a "GI bug" with nausea and diarrhea that have both since resolved.  PAST MEDICAL HISTORY (PMH):  Past Medical History:  Diagnosis Date  . Abnormal uterine bleeding   . Depression      PAST SURGICAL HISTORY (PSH):  No past surgical history on file.   MEDICATIONS:  Prior to Admission medications   Medication Sig Start Date End Date Taking? Authorizing Provider  cetirizine (ZYRTEC) 10 MG tablet Take 1 tablet (10 mg total) by mouth daily. 07/31/16   Rebecka ApleyWebster, Allison P, MD  diphenhydrAMINE (BENADRYL) 25 mg capsule Take 1 capsule (25 mg total) by mouth every 4 (four) hours as needed. 07/31/16 07/31/17  Rebecka ApleyWebster, Allison P, MD  escitalopram (LEXAPRO) 10 MG tablet Take 10 mg by mouth daily.    [provider]  etonogestrel (NEXPLANON) 68 MG IMPL implant 1 each by Subdermal route once.    [provider]   famotidine (PEPCID) 40 MG tablet Take 1 tablet (40 mg total) by mouth every evening. 07/31/16 07/31/17  Rebecka ApleyWebster, Allison P, MD  famotidine (PEPCID) 40 MG tablet Take 1 tablet (40 mg total) by mouth daily. 09/14/16 09/14/17  Rebecka ApleyWebster, Allison P, MD  fluconazole (DIFLUCAN) 150 MG tablet Take 1 tablet (150 mg total) by mouth once. Repeat in 72 hours. Patient not taking: Reported on 07/31/2016 07/09/15   Wallis BambergMani, Mario, PA-C  ibuprofen (ADVIL,MOTRIN) 600 MG tablet Take 1 tablet by mouth three times daily with meals 07/03/15   Loleta RoseForbach, Cory, MD  metoCLOPramide (REGLAN) 10 MG tablet Take 1 tablet (10 mg total) by mouth every 8 (eight) hours as needed for nausea. 01/01/17 01/04/17  Nita SickleVeronese, [place], MD  nitrofurantoin, macrocrystal-monohydrate, (MACROBID) 100 MG capsule Take 1 capsule (100 mg total) by mouth 2 (two) times daily. Patient not taking: Reported on 07/31/2016 07/14/15   Wallis BambergMani, Mario, PA-C  norgestimate-ethinyl estradiol (ORTHO-CYCLEN,SPRINTEC,PREVIFEM) 0.25-35 MG-MCG tablet Take 1 tablet by mouth daily. Patient not taking: Reported on 07/31/2016 07/09/15   Wallis BambergMani, Mario, PA-C  ondansetron (ZOFRAN) 4 MG tablet Take 4 mg by mouth daily as needed. 12/10/16   [provider]  penicillin v potassium (VEETID) 500 MG tablet Take 1 tablet (500 mg total) by mouth 3 (three) times daily. Patient not taking: Reported on 07/31/2016 05/03/16   Muthersbaugh, Dahlia ClientHannah, PA-C     ALLERGIES:  Allergies  Allergen Reactions  . Corn-Containing Products   . Dairy Aid [Lactase]   . Peanut-Containing Drug Products      SOCIAL HISTORY:  Social History  Social History  . Marital status: Single    Spouse name: N/A  . Number of children: N/A  . Years of education: N/A   Occupational History  . Not on file.   Social History Main Topics  . Smoking status: Current Every Day Smoker    Packs/day: 0.50    Years: 3.00    Types: Cigarettes  . Smokeless tobacco: Never Used  . Alcohol use 0.0 oz/week  . Drug use: Yes     Types: Marijuana  . Sexual activity: Not on file   Other Topics Concern  . Not on file   Social History Narrative  . No narrative on file    The patient currently resides (home / rehab facility / nursing home): Home  The patient normally is (ambulatory / bedbound) : Ambulatory   FAMILY HISTORY:  Family History  Problem Relation Age of Onset  . Hypertension Sister   . Diabetes Maternal Uncle   . Thyroid disease Mother     Otherwise negative/non-contributory.  REVIEW OF SYSTEMS:  Constitutional: denies any other weight loss, fever, chills, or sweats  Eyes: denies any other vision changes, history of eye injury  ENT: denies sore throat, hearing problems  Respiratory: denies shortness of breath, wheezing  Cardiovascular: denies chest pain, palpitations  Gastrointestinal: abdominal pain, N/V, and bowel function as per HPI Musculoskeletal: denies any other joint pains or cramps  Skin: Denies any other rashes or skin discolorations  Neurological: denies any other headache, dizziness, weakness  Psychiatric: Denies any other depression, anxiety   All other review of systems were otherwise negative   VITAL SIGNS:  BP 115/77   Pulse 96   Temp 97.9 F (36.6 C) (Oral)   Ht 5\' 3"  (1.6 m)   Wt 269 lb (122 kg)   BMI 47.65 kg/m   PHYSICAL EXAM:  Constitutional:  -- Obese body habitus  -- Awake, alert, and oriented x3  Eyes:  -- Pupils equally round and reactive to light  -- No scleral icterus  Ear, nose, throat:  -- No jugular venous distension -- No nasal drainage, bleeding Pulmonary:  -- No crackles  -- Equal breath sounds bilaterally -- Breathing non-labored at rest Cardiovascular:  -- S1, S2 present  -- No pericardial rubs  Gastrointestinal:  -- Abdomen soft, obese, nontender, nondistended, no guarding/rebound  -- No abdominal masses appreciated, pulsatile or otherwise  Musculoskeletal and Integumentary:  -- Wounds or skin discoloration: None -- Extremities:  B/L UE and LE FROM, hands and feet warm, no edema  Neurologic:  -- Motor function: Intact and symmetric -- Sensation: Intact and symmetric  Labs:  CBC:  Lab Results  Component Value Date   WBC 7.4 01/01/2017   RBC 4.38 01/01/2017   CMP Latest Ref Rng & Units 01/01/2017  Glucose 65 - 99 mg/dL 811(B109(H)  BUN 6 - 20 mg/dL <1(Y<5(L)  Creatinine 7.820.44 - 1.00 mg/dL 9.560.70  Sodium 213135 - 086145 mmol/L 140  Potassium 3.5 - 5.1 mmol/L 4.2  Chloride 101 - 111 mmol/L 109  CO2 22 - 32 mmol/L 23  Calcium 8.9 - 10.3 mg/dL 9.2  Total Protein 6.5 - 8.1 g/dL 7.3  Total Bilirubin 0.3 - 1.2 mg/dL 0.6  Alkaline Phos 38 - 126 U/L 74  AST 15 - 41 U/L 21  ALT 14 - 54 U/L 15    Imaging studies:  Limited RUQ Abdominal Ultrasound (01/01/2017) Gallbladder: Multiple mobile gallstones, the largest 1.6 cm. No wall thickening or sonographic Murphy's. Common bile duct diameter measures  4 mm (normal caliber).  Assessment/Plan:  26 y.o. female with post-prandial RUQ abdominal pain consistent with symptomatic cholelithiasis, complicated by co-morbidities including morbid obesity (BMI 48), abnormal uterine bleeding, and major depression disorder.   - avoid foods higher in fat content (meats, cheese/dairy, and fried), prefer whole grains, vegetables, and fruit   - all risks, benefits, and alternatives to laparoscopic cholecystectomy were discussed with the patient and her friend/partner present for her visit, all of their patient's and her friend/partner's questions were answered to their expressed satisfaction, patient expresses she wishes to proceed, and informed consent was obtained accordingly  - will plan for elective outpatient laparoscopic cholecystectomy Wednesday, 6/6  - post-surgical follow-up 2 weeks after above planned procedure  All of the above recommendations were discussed with the patient and her friend/partner present for her visit, and all of patient's and friend/partner's questions were answered to their  expressed satisfaction.  Thank you for the opportunity to participate in this patient's care.  -- Scherrie Gerlach Earlene Plater, MD, RPVI [place]: [facility] - Partnering for exceptional care. Office: [phone]

## 2017-01-30 NOTE — Discharge Instructions (Addendum)
In addition to included general post-operative instructions for Laparoscopic Cholecystectomy,  Diet: Resume home heart healthy diet.   Activity: No heavy lifting >20 pounds (children, pets, laundry, garbage) or strenuous activity until follow-up, but light activity and walking are encouraged. Do not drive or drink alcohol if taking narcotic pain medications.  Wound care: 2 days after surgery (Friday, 6/8), may shower/get incision wet with soapy water and pat dry (do not rub incisions), but no baths or submerging incision underwater until follow-up.   Medications: Resume all home medications. For mild to moderate pain: acetaminophen (Tylenol) or ibuprofen (if no kidney disease). Combining Tylenol with alcohol can substantially increase your risk of causing liver disease. Narcotic pain medications, if prescribed, can be used for severe pain, though may cause nausea, constipation, and drowsiness. Do not combine Tylenol and Percocet within a 6 hour period as Percocet contains Tylenol. If you do not need the narcotic pain medication, you do not need to fill the prescription.  Call office [phone]) at any time if any questions, worsening pain, fevers/chills, bleeding, drainage from incision site, or other concerns.  AMBULATORY SURGERY  DISCHARGE INSTRUCTIONS   1) The drugs that you were given will stay in your system until tomorrow so for the next 24 hours you should not:  A) Drive an automobile B) Make any legal decisions C) Drink any alcoholic beverage   2) You may resume regular meals tomorrow.  Today it is better to start with liquids and gradually work up to solid foods.  You may eat anything you prefer, but it is better to start with liquids, then soup and crackers, and gradually work up to solid foods.   3) Please notify your doctor immediately if you have any unusual bleeding, trouble breathing, redness and pain at the surgery site, drainage, fever, or pain not relieved by  medication.    4) Additional Instructions: TAKE A STOOL SOFTENER TWICE A DAY WHILE TAKING NARCOTIC PAIN MEDICINE TO PREVENT CONSTIPATION   Please contact your physician with any problems or Same Day Surgery at [phone], Monday through Friday 6 am to 4 pm, or [place] at [facility] Main number at [phone].

## 2017-01-30 NOTE — Anesthesia Postprocedure Evaluation (Signed)
Anesthesia Post Note  Patient: Briana Sexton  Procedure(s) Performed: Procedure(s) (LRB): LAPAROSCOPIC CHOLECYSTECTOMY (N/A)  Patient location during evaluation: PACU Anesthesia Type: General Level of consciousness: awake Pain management: pain level controlled Vital Signs Assessment: post-procedure vital signs reviewed and stable Respiratory status: spontaneous breathing Cardiovascular status: stable Anesthetic complications: no     Last Vitals:  Vitals:   01/30/17 0831 01/30/17 1113  BP: 139/80 (!) 98/50  Pulse: 84 89  Resp: 20 18  Temp: 36.9 C 36.8 C    Last Pain:  Vitals:   01/30/17 1113  TempSrc:   PainSc: Asleep                 VAN STAVEREN,Shakima Nisley

## 2017-01-30 NOTE — Op Note (Signed)
SURGICAL OPERATIVE REPORT   DATE OF PROCEDURE: 01/30/2017  ATTENDING Surgeon(s): Ancil Linseyavis, Hondo Nanda Evan, MD  ANESTHESIA: GETA  PRE-OPERATIVE DIAGNOSIS: Symptomatic Cholelithiasis (K80.20)  POST-OPERATIVE DIAGNOSIS: Symptomatic Cholelithiasis (K80.20)  PROCEDURE(S): (cpt's: 47562) 1.) Laparoscopic Cholecystectomy  INTRAOPERATIVE FINDINGS: Very large and dilated gallbladder with several moderately large gallstones and mild peri-cholecystic inflammation  INTRAOPERATIVE FLUIDS: 700 mL crystalloid   ESTIMATED BLOOD LOSS: Minimal (<30 mL)   URINE OUTPUT: No foley  SPECIMENS: Gallbladder  IMPLANTS: None  DRAINS: None   COMPLICATIONS: None apparent   CONDITION AT COMPLETION: Hemodynamically stable and extubated  DISPOSITION: PACU   INDICATION(S) FOR PROCEDURE:  Patient is a 26 y.o. female who previously presented with post-prandial RUQ > epigastric abdominal pain after eating fatty foods in particular. Ultrasound suggested cholelithiasis without acute cholecystitis. Patient was then seen in the office for follow-up, at which time she reports her pain improved when she avoided fatty foods, but she expressed wishes to be able to resume a more normal diet. All risks, benefits, and alternatives to above elective procedures were discussed with the patient, who elected to proceed, and informed consent was accordingly obtained at that time.   DETAILS OF PROCEDURE:  Patient was brought to the operating suite and appropriately identified. General anesthesia was administered along with peri-operative prophylactic IV antibiotics, and endotracheal intubation was performed by anesthesiologist, along with NG/OG tube for gastric decompression. In supine position, operative site was prepped and draped in usual sterile fashion, and following a brief time out, initial 5 mm incision was made in a natural skin crease just above the umbilicus. Fascia was then elevated, and a Verress needle was inserted and  its proper position confirmed using aspiration and saline meniscus test.  Upon insufflation of the abdominal cavity with carbon dioxide to a well-tolerated pressure of 12-15 mmHg, 5 mm peri-umbilical port followed by laparoscope were inserted and used to inspect the abdominal cavity and its contents with no injuries from insertion of the first trochar noted. Three additional trocars were inserted, one at the epigastric position (10 mm) and two along the Right costal margin (5 mm). The table was then placed in reverse Trendelenburg position with the Right side up. Filmy adhesions between the gallbladder and omentum/duodenum/transverse colon were lysed using combined blunt and sharp dissection. The apex/dome of the very large gallbladder was grasped with an atraumatic grasper passed through the lateral port and retracted apically over the liver. The infundibulum was also grasped and retracted, exposing Calot's triangle. The peritoneum overlying the gallbladder infundibulum was incised and dissected free of surrounding peritoneal attachments, revealing the cystic duct and cystic artery, which were clipped twice on the patient side and once on the gallbladder specimen side close to the gallbladder. The very large gallbladder was then dissected from its peritoneal attachments to the liver using electrocautery, and the gallbladder was placed into a laparoscopic specimen bag and removed from the abdominal cavity via the epigastric port site. Hemostasis and secure placement of clips were confirmed, and intra-peritoneal cavity was inspected with no additional findings. PMI laparoscopic fascial closure device was then used to re-approximate fascia at the 10 mm epigastric port site.  All ports were then removed under direct visualization, and abdominal cavity was desuflated. All port sites were irrigated/cleaned, additional local anesthetic was injected at each incision, 3-0 Vicryl was used to re-approximate dermis at 10  mm port site(s), and subcuticular 4-0 Monocryl suture was used to re-approximate skin. Skin was then cleaned, dried, and sterile skin glue was applied. Patient was  then safely able to be awakened, extubated, and transferred to PACU for post-operative monitoring and care.   I was present for all aspects of procedure, and there were no intra-operative complications apparent.

## 2017-01-30 NOTE — Transfer of Care (Signed)
Immediate Anesthesia Transfer of Care Note  Patient: Briana Sexton  Procedure(s) Performed: Procedure(s): LAPAROSCOPIC CHOLECYSTECTOMY (N/A)  Patient Location: PACU  Anesthesia Type:General  Level of Consciousness: sedated  Airway & Oxygen Therapy: Patient Spontanous Breathing and Patient connected to face mask oxygen  Post-op Assessment: Report given to RN and Post -op Vital signs reviewed and stable  Post vital signs: Reviewed and stable  Last Vitals:  Vitals:   01/30/17 0831 01/30/17 1113  BP: 139/80 (!) 98/50  Pulse: 84 89  Resp: 20 18  Temp: 36.9 C 36.8 C    Last Pain:  Vitals:   01/30/17 0831  TempSrc: Oral         Complications: No apparent anesthesia complications

## 2017-01-30 NOTE — Anesthesia Post-op Follow-up Note (Cosign Needed)
Anesthesia QCDR form completed.        

## 2017-01-30 NOTE — Anesthesia Preprocedure Evaluation (Signed)
Anesthesia Evaluation  Patient identified by MRN, date of birth, ID band Patient awake    Reviewed: Allergy & Precautions, NPO status , Patient's Chart, lab work & pertinent test results  Airway Mallampati: II       Dental  (+) Teeth Intact   Pulmonary neg pulmonary ROS, Current Smoker,    breath sounds clear to auscultation       Cardiovascular Exercise Tolerance: Good  Rhythm:Regular     Neuro/Psych  Headaches, Depression    GI/Hepatic Neg liver ROS, GERD  Medicated,  Endo/Other    Renal/GU negative Renal ROS     Musculoskeletal negative musculoskeletal ROS (+)   Abdominal (+) + obese,   Peds negative pediatric ROS (+)  Hematology negative hematology ROS (+)   Anesthesia Other Findings   Reproductive/Obstetrics                             Anesthesia Physical Anesthesia Plan  ASA: II  Anesthesia Plan: General   Post-op Pain Management:    Induction: Intravenous  PONV Risk Score and Plan: 1 and Ondansetron  Airway Management Planned: Oral ETT  Additional Equipment:   Intra-op Plan:   Post-operative Plan: Extubation in OR  Informed Consent: I have reviewed the patients History and Physical, chart, labs and discussed the procedure including the risks, benefits and alternatives for the proposed anesthesia with the patient or authorized representative who has indicated his/her understanding and acceptance.     Plan Discussed with: CRNA  Anesthesia Plan Comments:         Anesthesia Quick Evaluation

## 2017-01-30 NOTE — Interval H&P Note (Signed)
History and Physical Interval Note:  01/30/2017 8:44 AM  Briana Sexton  has presented today for surgery, with the diagnosis of Symptomatic cholelithiasis  The various methods of treatment have been discussed with the patient and family. After consideration of risks, benefits and other options for treatment, the patient has consented to  Procedure(s): LAPAROSCOPIC CHOLECYSTECTOMY (N/A) as a surgical intervention .  The patient's history has been reviewed, patient examined, no change in status, stable for surgery.  I have reviewed the patient's chart and labs.  Questions were answered to the patient's satisfaction.     Ancil LinseyJason Evan Prescious [place]less

## 2017-01-30 NOTE — Anesthesia Procedure Notes (Signed)

## 2017-01-31 ENCOUNTER — Ambulatory Visit (INDEPENDENT_AMBULATORY_CARE_PROVIDER_SITE_OTHER): Payer: 59 | Admitting: Psychology

## 2017-01-31 ENCOUNTER — Telehealth: Payer: Self-pay

## 2017-01-31 DIAGNOSIS — F321 Major depressive disorder, single episode, moderate: Secondary | ICD-10-CM | POA: Diagnosis not present

## 2017-01-31 LAB — SURGICAL PATHOLOGY

## 2017-01-31 NOTE — Telephone Encounter (Signed)
Patient had Laparoscopic Cholecystectomy on 01/30/2017. Her job is putting her back on the schedule for next week but she is in more pain than she thought she would be. She would like a work note stating that she can not go back to work until after her follow up that's scheduled on June 21st. 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.   If you would let me know when this work note is typed, I will go ahead and fax it with the additional information below:  Comments: Please attach a copy of this work note to her employee file.

## 2017-02-01 ENCOUNTER — Other Ambulatory Visit: Payer: Self-pay

## 2017-02-01 ENCOUNTER — Telehealth: Payer: Self-pay

## 2017-02-01 NOTE — Telephone Encounter (Signed)
Post-op call made to patient at this time. Left message for patient to call back so that I could see how she was feeling since having her surgery with Dr. Earlene Plateravis.

## 2017-02-03 ENCOUNTER — Encounter: Payer: Self-pay | Admitting: Emergency Medicine

## 2017-02-03 ENCOUNTER — Emergency Department
Admission: EM | Admit: 2017-02-03 | Discharge: 2017-02-03 | Disposition: A | Discharge status: Home / Self Care | Payer: 59 | Attending: Emergency Medicine | Admitting: Emergency Medicine

## 2017-02-03 DIAGNOSIS — F17210 Nicotine dependence, cigarettes, uncomplicated: Secondary | ICD-10-CM | POA: Insufficient documentation

## 2017-02-03 DIAGNOSIS — Z79899 Other long term (current) drug therapy: Secondary | ICD-10-CM | POA: Diagnosis not present

## 2017-02-03 DIAGNOSIS — R7303 Prediabetes: Secondary | ICD-10-CM | POA: Insufficient documentation

## 2017-02-03 DIAGNOSIS — R42 Dizziness and giddiness: Secondary | ICD-10-CM | POA: Insufficient documentation

## 2017-02-03 LAB — URINALYSIS, COMPLETE (UACMP) WITH MICROSCOPIC
Bilirubin Urine: NEGATIVE
Glucose, UA: NEGATIVE mg/dL
Hgb urine dipstick: NEGATIVE
KETONES UR: NEGATIVE mg/dL
Nitrite: NEGATIVE
PH: 5 (ref 5.0–8.0)
PROTEIN: NEGATIVE mg/dL
Specific Gravity, Urine: 1.023 (ref 1.005–1.030)

## 2017-02-03 LAB — CBC WITH DIFFERENTIAL/PLATELET
Basophils Absolute: 0.1 10*3/uL (ref 0–0.1)
Basophils Relative: 1 %
EOS ABS: 0.2 10*3/uL (ref 0–0.7)
EOS PCT: 2 %
HCT: 38.3 % (ref 35.0–47.0)
HEMOGLOBIN: 13.1 g/dL (ref 12.0–16.0)
LYMPHS ABS: 2.2 10*3/uL (ref 1.0–3.6)
Lymphocytes Relative: 22 %
MCH: 28.7 pg (ref 26.0–34.0)
MCHC: 34.1 g/dL (ref 32.0–36.0)
MCV: 84.2 fL (ref 80.0–100.0)
MONO ABS: 0.5 10*3/uL (ref 0.2–0.9)
MONOS PCT: 5 %
NEUTROS PCT: 70 %
Neutro Abs: 6.9 10*3/uL — ABNORMAL HIGH (ref 1.4–6.5)
Platelets: 364 10*3/uL (ref 150–440)
RBC: 4.55 MIL/uL (ref 3.80–5.20)
RDW: 14 % (ref 11.5–14.5)
WBC: 9.9 10*3/uL (ref 3.6–11.0)

## 2017-02-03 LAB — BASIC METABOLIC PANEL
Anion gap: 8 (ref 5–15)
BUN: 8 mg/dL (ref 6–20)
CALCIUM: 9.2 mg/dL (ref 8.9–10.3)
CHLORIDE: 102 mmol/L (ref 101–111)
CO2: 26 mmol/L (ref 22–32)
CREATININE: 0.71 mg/dL (ref 0.44–1.00)
GFR calc Af Amer: >60 mL/min (ref >60)
GFR calc non Af Amer: >60 mL/min (ref >60)
Glucose, Bld: 99 mg/dL (ref 65–99)
Potassium: 3.6 mmol/L (ref 3.5–5.1)
SODIUM: 136 mmol/L (ref 135–145)

## 2017-02-03 LAB — TROPONIN I: Troponin I: <0.03 ng/mL (ref <0.03)

## 2017-02-03 LAB — POCT PREGNANCY, URINE: PREG TEST UR: NEGATIVE

## 2017-02-03 NOTE — ED Triage Notes (Signed)
Pt arrive POV to triage with c/o dizziness. Pt had a cholecystomy x 3 days ago and awoke with dizziness this am. Pt denies LOC or and head trauma. Pt is in NAD at this time.

## 2017-02-03 NOTE — ED Provider Notes (Signed)
[facility] Emergency Department Provider Note  ____________________________________________   First MD Initiated Contact with Patient 02/03/17 [phone]     (approximate)  I have reviewed the triage vital signs and the nursing notes.   HISTORY  Chief Complaint Post-op Problem   HPI Briana Sexton is a 26 y.o. female who self presents to the emergency department with generalized fatigue and dizziness for the past 24 hours or so. She is roughly 3 days status post laparoscopic cholecystectomy and has done well postoperatively. She's had no fevers or chills. She's had no abdominal pain nausea or vomiting. She's had 2 bowel movements and is passing flatus multiple times.She is able to eat and drink without difficulty. She's had no chest pain or shortness of breath. No leg swelling. She became concerned because her discharge paperwork said if she felt lightheaded she should come to the emergency department. Her symptoms have been gradual onset seemed to be improving.   Past Medical History:  Diagnosis Date  . Abnormal uterine bleeding   . Depression   . GERD (gastroesophageal reflux disease)   . Headache    MIGRAINES  . Pre-diabetes     There are no active problems to display for this patient.   Past Surgical History:  Procedure Laterality Date  . CHOLECYSTECTOMY N/A 01/30/2017   Procedure: LAPAROSCOPIC CHOLECYSTECTOMY;  Surgeon: Ancil Linsey, MD;  Location: [facility] ORS;  Service: General;  Laterality: N/A;  . NO PAST SURGERIES      Prior to Admission medications   Medication Sig Start Date End Date Taking? Authorizing Provider  cetirizine (ZYRTEC) 10 MG tablet Take 1 tablet (10 mg total) by mouth daily. Patient taking differently: Take 10 mg by mouth as needed.  07/31/16   Rebecka Apley, MD  diphenhydrAMINE (BENADRYL) 25 mg capsule Take 1 capsule (25 mg total) by mouth every 4 (four) hours as needed. 07/31/16 07/31/17  Rebecka Apley, MD    escitalopram (LEXAPRO) 10 MG tablet Take 10 mg by mouth daily.    [provider]  etonogestrel (NEXPLANON) 68 MG IMPL implant 1 each by Subdermal route once.    [provider]  famotidine (PEPCID) 40 MG tablet Take 1 tablet (40 mg total) by mouth every evening. 07/31/16 07/31/17  Rebecka Apley, MD  famotidine (PEPCID) 40 MG tablet Take 1 tablet (40 mg total) by mouth daily. Patient taking differently: Take 40 mg by mouth as needed.  09/14/16 09/14/17  Rebecka Apley, MD  fluconazole (DIFLUCAN) 150 MG tablet Take 1 tablet (150 mg total) by mouth once. Repeat in 72 hours. Patient not taking: Reported on 07/31/2016 07/09/15   Wallis Bamberg, PA-C  ibuprofen (ADVIL,MOTRIN) 600 MG tablet Take 1 tablet by mouth three times daily with meals 07/03/15   Loleta Rose, MD  metoCLOPramide (REGLAN) 10 MG tablet Take 1 tablet (10 mg total) by mouth every 8 (eight) hours as needed for nausea. 01/01/17 01/22/17  Nita Sickle, MD  nitrofurantoin, macrocrystal-monohydrate, (MACROBID) 100 MG capsule Take 1 capsule (100 mg total) by mouth 2 (two) times daily. Patient not taking: Reported on 07/31/2016 07/14/15   Wallis Bamberg, PA-C  norgestimate-ethinyl estradiol (ORTHO-CYCLEN,SPRINTEC,PREVIFEM) 0.25-35 MG-MCG tablet Take 1 tablet by mouth daily. Patient not taking: Reported on 07/31/2016 07/09/15   Wallis Bamberg, PA-C  ondansetron (ZOFRAN) 4 MG tablet Take 4 mg by mouth daily as needed. 12/10/16   [provider]  oxyCODONE (ROXICODONE) 5 MG immediate release tablet Take 1 tablet (5 mg total) by mouth  every 4 (four) hours as needed for severe pain. 01/30/17 01/30/18  Ancil Linseyavis, Jason Evan, MD  penicillin v potassium (VEETID) 500 MG tablet Take 1 tablet (500 mg total) by mouth 3 (three) times daily. Patient not taking: Reported on 07/31/2016 05/03/16   Muthersbaugh, Dahlia ClientHannah, PA-C    Allergies Corn-containing products; Dairy aid [lactase]; and Peanut-containing drug products  Family History   Problem Relation Age of Onset  . Hypertension Sister   . Diabetes Maternal Uncle   . Thyroid disease Mother     Social History Social History  Substance Use Topics  . Smoking status: Current Every Day Smoker    Packs/day: 0.50    Years: 3.00    Types: Cigarettes  . Smokeless tobacco: Never Used  . Alcohol use 0.0 oz/week     Comment: OCC-ONCE WEEKLY    Review of Systems Constitutional: No fever/chills Eyes: No visual changes. ENT: No sore throat. Cardiovascular: Denies chest pain. Respiratory: Denies shortness of breath. Gastrointestinal: No abdominal pain.  No nausea, no vomiting.  No diarrhea.  No constipation. Genitourinary: Negative for dysuria. Musculoskeletal: Negative for back pain. Skin: Negative for rash. Neurological: Negative for headaches, focal weakness or numbness.   ____________________________________________   PHYSICAL EXAM:  VITAL SIGNS: ED Triage Vitals  Enc Vitals Group     BP 02/03/17 0349 132/82     Pulse Rate 02/03/17 0349 75     Resp 02/03/17 0349 18     Temp 02/03/17 0349 97.8 F (36.6 C)     Temp Source 02/03/17 0349 Oral     SpO2 02/03/17 0349 98 %     Weight 02/03/17 0350 258 lb 4.8 oz (117.2 kg)     Height 02/03/17 0350 5\' 3"  (1.6 m)     Head Circumference --      Peak Flow --      Pain Score 02/03/17 0352 5     Pain Loc --      Pain Edu? --      Excl. in GC? --     Constitutional: Alert and oriented x 4 well appearing nontoxic no diaphoresis speaks in full, clear sentences Eyes: PERRL EOMI. Head: Atraumatic. Nose: No congestion/rhinnorhea. Mouth/Throat: No trismus Neck: No stridor.   Cardiovascular: Normal rate, regular rhythm. Grossly normal heart sounds.  Good peripheral circulation. Respiratory: Normal respiratory effort.  No retractions. Lungs CTAB and moving good air Gastrointestinal: Obese abdomen soft nondistended nontender no rebound no guarding no peritonitis well healing laparoscopic surgical  scars Musculoskeletal: No lower extremity edema  legs are equal in size Neurologic:  Normal speech and language. No gross focal neurologic deficits are appreciated. Skin:  Skin is warm, dry and intact. No rash noted. Psychiatric: Mood and affect are normal. Speech and behavior are normal.    ____________________________________________  ____________________________________________   LABS (all labs ordered are listed, but only abnormal results are displayed)  Labs Reviewed  CBC WITH DIFFERENTIAL/PLATELET - Abnormal; Notable for the following:       Result Value   Neutro Abs 6.9 (*)    All other components within normal limits  URINALYSIS, COMPLETE (UACMP) WITH MICROSCOPIC - Abnormal; Notable for the following:    Color, Urine YELLOW (*)    APPearance HAZY (*)    Leukocytes, UA TRACE (*)    Bacteria, UA FEW (*)    Squamous Epithelial / LPF 6-30 (*)    All other components within normal limits  BASIC METABOLIC PANEL  TROPONIN I  POCT PREGNANCY, URINE  No signs of anemia no signs of infection __________________________________________  EKG  ED ECG REPORT I, Merrily Brittle, the attending physician, personally viewed and interpreted this ECG.  Date: 02/03/2017 Rate: 76 Rhythm: normal sinus rhythm QRS Axis: normal Intervals: normal ST/T Wave abnormalities: normal Conduction Disturbances: none Narrative Interpretation: Some LVH but no signs of acute ischemia. No signs of right heart strain  ____________________________________________  RADIOLOGY   ____________________________________________   PROCEDURES  Procedure(s) performed: no  Procedures  Critical Care performed: no  Observation: no ____________________________________________   INITIAL IMPRESSION / ASSESSMENT AND PLAN / ED COURSE  Pertinent labs & imaging results that were available during my care of the patient were reviewed by me and considered in my medical decision making (see chart for  details).  The patient arrives very well-appearing with a benign abdominal exam. I had a lengthy discussion with the patient and her mother regarding concerns after surgery which include bleeding, infection, damage to structures around her operative site. The patient is passing flatus and having bowel movements in her abdomen is nonpertinent headache. We discussed the possibility of a CT scan, however I think the chance of a postoperative complication at this point is extremely low. I doubt pulmonary embolism at this time. Charity has follow-up scheduled in 11 days of strict return precautions given. She is discharged home in good condition.      ____________________________________________   FINAL CLINICAL IMPRESSION(S) / ED DIAGNOSES  Final diagnoses:  Lightheaded      NEW MEDICATIONS STARTED DURING THIS VISIT:  New Prescriptions   No medications on file     Note:  This document was prepared using Dragon voice recognition software and may include unintentional dictation errors.     Merrily Brittle, MD 02/03/17 [phone]

## 2017-02-03 NOTE — ED Notes (Signed)
Pt verbalized understanding of discharge instructions. NAD at this time. 

## 2017-02-03 NOTE — Discharge Instructions (Signed)
Please keep your follow-up appointment with your surgeon on June 21 as scheduled. Please return to the emergency department for any new or worsening symptoms such as if he develops fever, abdominal pain, nausea or vomiting, if you cannot eat, or for any other concerns whatsoever.  It was a pleasure to take care of you today, and thank you for coming to our emergency department.  If you have any questions or concerns before leaving please ask the nurse to grab me and I'm more than happy to go through your aftercare instructions again.  If you were prescribed any opioid pain medication today such as Norco, Vicodin, Percocet, morphine, hydrocodone, or oxycodone please make sure you do not drive when you are taking this medication as it can alter your ability to drive safely.  If you have any concerns once you are home that you are not improving or are in fact getting worse before you can make it to your follow-up appointment, please do not hesitate to call 911 and come back for further evaluation.  Merrily Brittle MD  Results for orders placed or performed during the hospital encounter of 02/03/17  CBC with Differential  Result Value Ref Range   WBC 9.9 3.6 - 11.0 K/uL   RBC 4.55 3.80 - 5.20 MIL/uL   Hemoglobin 13.1 12.0 - 16.0 g/dL   HCT 29.5 28.4 - 13.2 %   MCV 84.2 80.0 - 100.0 fL   MCH 28.7 26.0 - 34.0 pg   MCHC 34.1 32.0 - 36.0 g/dL   RDW 44.0 10.2 - 72.5 %   Platelets 364 150 - 440 K/uL   Neutrophils Relative % 70 %   Neutro Abs 6.9 (H) 1.4 - 6.5 K/uL   Lymphocytes Relative 22 %   Lymphs Abs 2.2 1.0 - 3.6 K/uL   Monocytes Relative 5 %   Monocytes Absolute 0.5 0.2 - 0.9 K/uL   Eosinophils Relative 2 %   Eosinophils Absolute 0.2 0 - 0.7 K/uL   Basophils Relative 1 %   Basophils Absolute 0.1 0 - 0.1 K/uL  Basic metabolic panel  Result Value Ref Range   Sodium 136 135 - 145 mmol/L   Potassium 3.6 3.5 - 5.1 mmol/L   Chloride 102 101 - 111 mmol/L   CO2 26 22 - 32 mmol/L   Glucose,  Bld 99 65 - 99 mg/dL   BUN 8 6 - 20 mg/dL   Creatinine, Ser 3.66 0.44 - 1.00 mg/dL   Calcium 9.2 8.9 - 44.0 mg/dL   GFR calc non Af Amer >60 >60 mL/min   GFR calc Af Amer >60 >60 mL/min   Anion gap 8 5 - 15  Urinalysis, Complete w Microscopic  Result Value Ref Range   Color, Urine YELLOW (A) YELLOW   APPearance HAZY (A) CLEAR   Specific Gravity, Urine 1.023 1.005 - 1.030   pH 5.0 5.0 - 8.0   Glucose, UA NEGATIVE NEGATIVE mg/dL   Hgb urine dipstick NEGATIVE NEGATIVE   Bilirubin Urine NEGATIVE NEGATIVE   Ketones, ur NEGATIVE NEGATIVE mg/dL   Protein, ur NEGATIVE NEGATIVE mg/dL   Nitrite NEGATIVE NEGATIVE   Leukocytes, UA TRACE (A) NEGATIVE   RBC / HPF 0-5 0 - 5 RBC/hpf   WBC, UA 6-30 0 - 5 WBC/hpf   Bacteria, UA FEW (A) NONE SEEN   Squamous Epithelial / LPF 6-30 (A) NONE SEEN   WBC Clumps PRESENT    Mucous PRESENT   Troponin I  Result Value Ref Range  Troponin I <0.03 <0.03 ng/mL  Pregnancy, urine POC  Result Value Ref Range   Preg Test, Ur NEGATIVE NEGATIVE

## 2017-02-05 ENCOUNTER — Telehealth: Payer: Self-pay

## 2017-02-05 NOTE — Telephone Encounter (Signed)
Patient called at this time complaining of shoulder pain. We discussed that she may still have some gas trapped. She was instructed to move about to get the gas moving so she could belch or pass gas.  Patient  Is still taking pain medicines at this time. Patient was seen in emergency room 2 days ago and all was normal.  Patient stated she would try this and would call back if no relief.

## 2017-02-08 ENCOUNTER — Encounter: Payer: No Typology Code available for payment source | Admitting: Surgery

## 2017-02-14 ENCOUNTER — Telehealth: Payer: Self-pay | Admitting: General Practice

## 2017-02-14 ENCOUNTER — Ambulatory Visit (INDEPENDENT_AMBULATORY_CARE_PROVIDER_SITE_OTHER): Payer: 59 | Admitting: General Surgery

## 2017-02-14 ENCOUNTER — Ambulatory Visit (INDEPENDENT_AMBULATORY_CARE_PROVIDER_SITE_OTHER): Payer: 59 | Admitting: Psychology

## 2017-02-14 ENCOUNTER — Encounter: Payer: Self-pay | Admitting: General Surgery

## 2017-02-14 ENCOUNTER — Telehealth: Payer: Self-pay

## 2017-02-14 ENCOUNTER — Other Ambulatory Visit
Admission: RE | Admit: 2017-02-14 | Discharge: 2017-02-14 | Disposition: A | Discharge status: Home / Self Care | Payer: 59 | Source: Ambulatory Visit | Attending: General Surgery | Admitting: General Surgery

## 2017-02-14 VITALS — BP 105/69 | HR 89 | Temp 98.4°F | Ht 63.0 in | Wt 260.4 lb

## 2017-02-14 DIAGNOSIS — F331 Major depressive disorder, recurrent, moderate: Secondary | ICD-10-CM | POA: Diagnosis not present

## 2017-02-14 DIAGNOSIS — R110 Nausea: Secondary | ICD-10-CM | POA: Diagnosis present

## 2017-02-14 LAB — CBC WITH DIFFERENTIAL/PLATELET
BASOS ABS: 0.1 10*3/uL (ref 0–0.1)
BASOS PCT: 1 %
EOS PCT: 3 %
Eosinophils Absolute: 0.2 10*3/uL (ref 0–0.7)
HCT: 35.9 % (ref 35.0–47.0)
Hemoglobin: 12.3 g/dL (ref 12.0–16.0)
Lymphocytes Relative: 21 %
Lymphs Abs: 1.6 10*3/uL (ref 1.0–3.6)
MCH: 28.3 pg (ref 26.0–34.0)
MCHC: 34.4 g/dL (ref 32.0–36.0)
MCV: 82.2 fL (ref 80.0–100.0)
MONO ABS: 0.4 10*3/uL (ref 0.2–0.9)
Monocytes Relative: 5 %
Neutro Abs: 5.2 10*3/uL (ref 1.4–6.5)
Neutrophils Relative %: 70 %
PLATELETS: 390 10*3/uL (ref 150–440)
RBC: 4.36 MIL/uL (ref 3.80–5.20)
RDW: 13.8 % (ref 11.5–14.5)
WBC: 7.4 10*3/uL (ref 3.6–11.0)

## 2017-02-14 LAB — COMPREHENSIVE METABOLIC PANEL
ALBUMIN: 3.8 g/dL (ref 3.5–5.0)
ALK PHOS: 69 U/L (ref 38–126)
ALT: 15 U/L (ref 14–54)
ANION GAP: 6 (ref 5–15)
AST: 21 U/L (ref 15–41)
BILIRUBIN TOTAL: 0.4 mg/dL (ref 0.3–1.2)
BUN: 7 mg/dL (ref 6–20)
CALCIUM: 9.2 mg/dL (ref 8.9–10.3)
CO2: 24 mmol/L (ref 22–32)
Chloride: 105 mmol/L (ref 101–111)
Creatinine, Ser: 0.55 mg/dL (ref 0.44–1.00)
GFR calc Af Amer: >60 mL/min (ref >60)
GLUCOSE: 101 mg/dL — AB (ref 65–99)
POTASSIUM: 4.1 mmol/L (ref 3.5–5.1)
Sodium: 135 mmol/L (ref 135–145)
TOTAL PROTEIN: 7.4 g/dL (ref 6.5–8.1)

## 2017-02-14 NOTE — Telephone Encounter (Signed)
Patient called and left a message at 1:02 pm. She said she received a doctor's release note, but she is needed this letter/fmla paperwork faxed to her HR department with work. Please call patient and advice.

## 2017-02-14 NOTE — Progress Notes (Signed)
Outpatient Surgical Follow Up  02/14/2017  Briana Sexton is an 26 y.o. female.   Chief Complaint  Patient presents with  . Routine Post Op    Laparoscopic Cholecystectomy-01/30/17-Dr.Davis    HPI: 26 year old female returns to clinic now 2 weeks status post laparoscopic cholecystectomy. Patient reports she continues to have intermittent nausea and vomiting but her abdominal pain is improved. Her last episode of nausea and vomiting was 2 days prior to this visit. She is unsure what brings it on. She currently denies any fevers, chills, chest pain, shortness of breath, diarrhea, constipation.  Past Medical History:  Diagnosis Date  . Abnormal uterine bleeding   . Depression   . GERD (gastroesophageal reflux disease)   . Headache    MIGRAINES  . Pre-diabetes     Past Surgical History:  Procedure Laterality Date  . CHOLECYSTECTOMY N/A 01/30/2017   Procedure: LAPAROSCOPIC CHOLECYSTECTOMY;  Surgeon: Ancil Linseyavis, Jason Evan, MD;  Location: [facility] ORS;  Service: General;  Laterality: N/A;  . NO PAST SURGERIES      Family History  Problem Relation Age of Onset  . Hypertension Sister   . Diabetes Maternal Uncle   . Thyroid disease Mother     Social History:  reports that she has been smoking Cigarettes.  She has a 1.50 pack-year smoking history. She has never used smokeless tobacco. She reports that she drinks alcohol. She reports that she uses drugs, including Marijuana.  Allergies:  Allergies  Allergen Reactions  . Corn-Containing Products Nausea And Vomiting and Swelling  . Dairy Aid [Lactase] Nausea And Vomiting  . Peanut-Containing Drug Products Hives and Swelling    Medications reviewed.    ROS A multipoint review of systems was completed, all pertinent positives and negatives are documented in the history of present illness and remainder are negative   BP 105/69   Pulse 89   Temp 98.4 F (36.9 C) (Oral)   Ht 5\' 3"  (1.6 m)   Wt 118.1 kg (260 lb 6.4 oz)   BMI 46.13 kg/m    Physical Exam Gen.: No acute distress Chest: Clear to auscultation Heart: Regular rhythm Abdomen: Soft, tender to palpation at incision sites especially the upper midline, and nondistended. Incision sites are mostly well approximated with hyperemia spreading from the right upper quadrant 5 mm incision sites. No evidence of drainage from any incision.    No results found for this or any previous visit (from the past 48 hour(s)). No results found.  Assessment/Plan:  1. Nausea without vomiting 26 year old female 2 weeks status post laparoscopic cholecystectomy with continued nausea and vomiting since surgery. Discussed with patient that we would obtain labs today and plan to have her follow up with her operative surgeon on Monday. No per work be provided today. She voiced understanding and will return to clinic on Monday - CBC with Differential - Comprehensive metabolic panel     Briana Frameharles Conor Filsaime, MD [facility] General Surgeon  02/14/2017,11:17 AM

## 2017-02-14 NOTE — Telephone Encounter (Signed)
Patient notified of Normal labs per Dr.Woodham

## 2017-02-14 NOTE — Telephone Encounter (Signed)
Called patient back and asked what she wanted me to do for her. She stated that she wanted me to fax her disability certificate to her employer.  Fax Number: [phone] Attention: Imagene GurneyAnaleese

## 2017-02-14 NOTE — Patient Instructions (Signed)
Please stop by the Lab today when you leave our office. Please see your follow appointment listed below. Please call our office if you have questions or concerns.

## 2017-02-15 DIAGNOSIS — K8020 Calculus of gallbladder without cholecystitis without obstruction: Secondary | ICD-10-CM

## 2017-02-18 ENCOUNTER — Ambulatory Visit (INDEPENDENT_AMBULATORY_CARE_PROVIDER_SITE_OTHER): Payer: 59 | Admitting: Surgery

## 2017-02-18 ENCOUNTER — Encounter: Payer: Self-pay | Admitting: Surgery

## 2017-02-18 VITALS — BP 108/84 | HR 89 | Temp 99.8°F | Ht 63.0 in | Wt 258.2 lb

## 2017-02-18 DIAGNOSIS — K8020 Calculus of gallbladder without cholecystitis without obstruction: Secondary | ICD-10-CM

## 2017-02-18 NOTE — Progress Notes (Signed)
Surgical Clinic Progress/Follow-up Note   HPI:  26 y.o. Female presents to clinic for post-op follow-up evaluation s/p laparoscopic cholecystectomy. At patient's recent post-operative follow-up evaluation with Dr. Tonita CongWoodham last Thursday (4 days ago), patient reported persistent nausea without emesis. Today, patient reports complete resolution of any nausea while tolerating her normal diet with normal flatus and BM's without any post-prandial or peri-incisional abdominal pain. Patient otherwise denies any fever/chills, CP, or SOB.  Review of Systems:  Constitutional: denies any other weight loss, fever, chills, or sweats  Eyes: denies any other vision changes, history of eye injury  ENT: denies sore throat, hearing problems  Respiratory: denies shortness of breath, wheezing  Cardiovascular: denies chest pain, palpitations  Gastrointestinal: abdominal pain, N/V, and bowel function as per HPI Musculoskeletal: denies any other joint pains or cramps  Skin: Denies any other rashes or skin discolorations  Neurological: denies any other headache, dizziness, weakness  Psychiatric: denies any other depression, anxiety  All other review of systems: otherwise negative   Vital Signs:  BP 108/84   Pulse 89   Temp 99.8 F (37.7 C) (Oral)   Ht 5\' 3"  (1.6 m)   Wt 258 lb 3.2 oz (117.1 kg)   BMI 45.74 kg/m    Physical Exam:  Constitutional:  -- Obese body habitus  -- Awake, alert, and oriented x3  Eyes:  -- Pupils equally round and reactive to light  -- No scleral icterus  Ear, nose, throat:  -- No jugular venous distension  -- No nasal drainage, bleeding Pulmonary:  -- No crackles  -- No dullness to percussion  Cardiovascular:  -- S1, S2 present  -- No pericardial rubs  Gastrointestinal:  -- Soft, nontender, nondistended, no guarding/rebound -- Incisions well-approximated without any erythema or drainage -- No abdominal masses appreciated, pulsatile or otherwise  Musculoskeletal /  Integumentary:  -- Wounds or skin discoloration: None except postsurgical scars as above (see GI)  -- Extremities: B/L UE and LE FROM, hands and feet warm, no edema  Neurologic:  -- Motor function: intact and symmetric  -- Sensation: intact and symmetric   Assessment:  26 y.o. yo Female with a problem list including...  Patient Active Problem List   Diagnosis Date Noted  . Calculus of gallbladder without cholecystitis without obstruction     presents to clinic for post-op evaluation, doing well with resolution of pre-operative post-prandial RUQ abdominal pain without any previously reported lightheadedness/dizziness and resolved nausea s/p laparoscopic cholecystectomy for symptomatic cholelithiasis.  Plan:   - advance diet as tolerated   - okay to submerge incisions under water (baths, swimming) prn  - gradually continue to resume activities without restrictions, apply sunblock particularly to incisions if sun exposure  - return to clinic as needed, instructed to call office if any questions or concerns  All of the above recommendations were discussed with the patient, and all of patient's questions were answered to her expressed satisfaction.  -- Scherrie GerlachJason E. Earlene Plateravis, MD, RPVI [place]: [facility] General Surgery - Partnering for exceptional care. Office: [phone]

## 2017-02-18 NOTE — Patient Instructions (Signed)
Please call our office with any questions or concerns.  You may submerge in a tub, hot tub, or pool until incisions are completely sealed.  Use sun block to incision area over the next year if this area will be exposed to sun. This helps decrease scarring.  You may resume your normal activities on 03/23/17. At that time- Listen to your body when lifting, if you have pain when lifting, stop and then try again in a few days. Pain after doing exercises or activities of daily living is normal as you get back in to your normal routine.  If you develop redness, drainage, or pain at incision sites- call our office immediately and speak with a nurse.

## 2017-02-21 ENCOUNTER — Ambulatory Visit (INDEPENDENT_AMBULATORY_CARE_PROVIDER_SITE_OTHER): Payer: 59 | Admitting: Psychology

## 2017-02-21 DIAGNOSIS — F331 Major depressive disorder, recurrent, moderate: Secondary | ICD-10-CM

## 2017-03-07 ENCOUNTER — Ambulatory Visit (INDEPENDENT_AMBULATORY_CARE_PROVIDER_SITE_OTHER): Payer: 59 | Admitting: Psychology

## 2017-03-07 DIAGNOSIS — F331 Major depressive disorder, recurrent, moderate: Secondary | ICD-10-CM | POA: Diagnosis not present

## 2017-03-20 ENCOUNTER — Ambulatory Visit: Payer: Self-pay | Admitting: Psychology

## 2017-05-15 ENCOUNTER — Ambulatory Visit (INDEPENDENT_AMBULATORY_CARE_PROVIDER_SITE_OTHER): Payer: 59 | Admitting: Psychology

## 2017-05-15 DIAGNOSIS — F331 Major depressive disorder, recurrent, moderate: Secondary | ICD-10-CM

## 2017-06-20 ENCOUNTER — Ambulatory Visit (INDEPENDENT_AMBULATORY_CARE_PROVIDER_SITE_OTHER): Payer: 59 | Admitting: Psychology

## 2017-06-20 DIAGNOSIS — F331 Major depressive disorder, recurrent, moderate: Secondary | ICD-10-CM

## 2017-07-08 ENCOUNTER — Ambulatory Visit (INDEPENDENT_AMBULATORY_CARE_PROVIDER_SITE_OTHER): Payer: 59 | Admitting: Psychology

## 2017-07-08 DIAGNOSIS — F331 Major depressive disorder, recurrent, moderate: Secondary | ICD-10-CM | POA: Diagnosis not present

## 2017-07-24 ENCOUNTER — Ambulatory Visit (INDEPENDENT_AMBULATORY_CARE_PROVIDER_SITE_OTHER): Payer: 59 | Admitting: Psychology

## 2017-07-24 DIAGNOSIS — F331 Major depressive disorder, recurrent, moderate: Secondary | ICD-10-CM | POA: Diagnosis not present

## 2017-08-06 ENCOUNTER — Ambulatory Visit (INDEPENDENT_AMBULATORY_CARE_PROVIDER_SITE_OTHER): Payer: 59 | Admitting: Psychology

## 2017-08-06 DIAGNOSIS — F4323 Adjustment disorder with mixed anxiety and depressed mood: Secondary | ICD-10-CM

## 2017-08-30 ENCOUNTER — Ambulatory Visit (INDEPENDENT_AMBULATORY_CARE_PROVIDER_SITE_OTHER): Payer: 59 | Admitting: Psychology

## 2017-08-30 DIAGNOSIS — F331 Major depressive disorder, recurrent, moderate: Secondary | ICD-10-CM

## 2017-09-06 ENCOUNTER — Ambulatory Visit: Payer: 59 | Admitting: Psychology

## 2017-09-20 ENCOUNTER — Ambulatory Visit (INDEPENDENT_AMBULATORY_CARE_PROVIDER_SITE_OTHER): Payer: 59 | Admitting: Psychology

## 2017-09-20 DIAGNOSIS — F331 Major depressive disorder, recurrent, moderate: Secondary | ICD-10-CM | POA: Diagnosis not present

## 2017-10-09 ENCOUNTER — Ambulatory Visit (INDEPENDENT_AMBULATORY_CARE_PROVIDER_SITE_OTHER): Payer: 59 | Admitting: Family Medicine

## 2017-10-09 ENCOUNTER — Encounter: Payer: Self-pay | Admitting: Family Medicine

## 2017-10-09 VITALS — BP 128/85 | HR 91 | Wt 268.6 lb

## 2017-10-09 DIAGNOSIS — N760 Acute vaginitis: Secondary | ICD-10-CM

## 2017-10-09 DIAGNOSIS — B9689 Other specified bacterial agents as the cause of diseases classified elsewhere: Secondary | ICD-10-CM | POA: Diagnosis not present

## 2017-10-09 DIAGNOSIS — Z23 Encounter for immunization: Secondary | ICD-10-CM | POA: Diagnosis not present

## 2017-10-09 DIAGNOSIS — Z01419 Encounter for gynecological examination (general) (routine) without abnormal findings: Secondary | ICD-10-CM

## 2017-10-09 DIAGNOSIS — Z720 Tobacco use: Secondary | ICD-10-CM

## 2017-10-09 DIAGNOSIS — Z01411 Encounter for gynecological examination (general) (routine) with abnormal findings: Secondary | ICD-10-CM | POA: Diagnosis not present

## 2017-10-09 DIAGNOSIS — Z113 Encounter for screening for infections with a predominantly sexual mode of transmission: Secondary | ICD-10-CM

## 2017-10-09 NOTE — Progress Notes (Signed)
LAST PAP 06/2015 FLU SHOT TODAY

## 2017-10-09 NOTE — Progress Notes (Addendum)
   GYNECOLOGY ANNUAL PREVENTATIVE CARE ENCOUNTER NOTE  Subjective:   Briana Sexton is a 27 y.o. G0P0000 female here for a routine annual gynecologic exam.  Current complaints: vagina odor with discharge and irritation- present for several months, occasional in nature   Denies abnormal vaginal bleeding, discharge, pelvic pain, problems with intercourse or other gynecologic concerns.    Gynecologic History No LMP recorded. Patient has had an implant. Contraception: Nexplanon Last Pap: 06/2015. Results were: normal Last mammogram: NA.   The following portions of the patient's history were reviewed and updated as appropriate: allergies, current medications, past family history, past medical history, past social history, past surgical history and problem list.  Review of Systems Pertinent items are noted in HPI.   Objective:  BP 128/85   Pulse 91   Wt 268 lb 9.6 oz (121.8 kg)   BMI 47.58 kg/m  CONSTITUTIONAL: Well-developed, well-nourished female in no acute distress.  HENT:  Normocephalic, atraumatic, External right and left ear normal. Oropharynx is clear and moist EYES:  No scleral icterus.  NECK: Normal range of motion, supple, no masses.  Normal thyroid.  SKIN: Skin is warm and dry. No rash noted. Not diaphoretic. No erythema. No pallor. NEUROLOGIC: Alert and oriented to person, place, and time. Normal reflexes, muscle tone coordination. No cranial nerve deficit noted. PSYCHIATRIC: Normal mood and affect. Normal behavior. Normal judgment and thought content. CARDIOVASCULAR: Normal heart rate noted, regular rhythm. 2+ distal pulses. RESPIRATORY: Effort and breath sounds normal, no problems with respiration noted. BREASTS: Symmetric in size. No masses, skin changes, nipple drainage, or lymphadenopathy. ABDOMEN: Soft,  no distention noted.  No tenderness, rebound or guarding.  PELVIC: Normal appearing external genitalia; normal appearing vaginal mucosa and cervix.  White discharge  noted, no strong odor.  Pap due in NOV 2019.  Normal uterine size, no other palpable masses, no uterine or adnexal tenderness. MUSCULOSKELETAL: Normal range of motion.     Assessment and Plan:  1) Annual gynecologic examination without pap smear: STI screen also ordered today.  Routine preventative health maintenance measures emphasized.  2) Contraception counseling: Reviewed all forms of birth control options available. Patient current on Implant and happy with method. She will continue to use this method.  She will follow up in 2  for surveillance.  She was told to call with any further questions, or with any concerns about this method of contraception.  Emphasized use of condoms 100% of the time for STI prevention.  3) vaginal discharge- could be BV. Testing ordered today. If BV pos recommend treatment with metronidazole.   4) immunizations- need flu today.   Please refer to After Visit Summary for other counseling recommendations.   Return in about 9 months (around 06/27/2018) for pap smear.  Federico FlakeKimberly Niles Willella Harding, MD, MPH, ABFM Attending Physician Faculty Practice- [facility]

## 2017-10-10 LAB — CERVICOVAGINAL ANCILLARY ONLY
BACTERIAL VAGINITIS: POSITIVE — AB
CANDIDA VAGINITIS: NEGATIVE
CHLAMYDIA, DNA PROBE: NEGATIVE
NEISSERIA GONORRHEA: NEGATIVE
Trichomonas: NEGATIVE

## 2017-10-10 LAB — HIV ANTIBODY (ROUTINE TESTING W REFLEX): HIV SCREEN 4TH GENERATION: NONREACTIVE

## 2017-10-10 LAB — RPR: RPR: NONREACTIVE

## 2017-10-11 MED ORDER — METRONIDAZOLE 500 MG PO TABS: 500.0000 mg | ORAL_TABLET | Freq: Two times a day (BID) | ORAL | 0 refills | Status: DC

## 2017-10-11 NOTE — Addendum Note (Signed)
Addended by: Geanie BerlinNEWTON, Davielle Lingelbach N on: 10/11/2017 03:16 PM   Modules accepted: Orders

## 2017-10-14 ENCOUNTER — Other Ambulatory Visit: Payer: Self-pay | Admitting: Family Medicine

## 2017-10-14 ENCOUNTER — Ambulatory Visit
Admission: RE | Admit: 2017-10-14 | Discharge: 2017-10-14 | Disposition: A | Discharge status: Home / Self Care | Payer: 59 | Source: Ambulatory Visit | Attending: Family Medicine | Admitting: Family Medicine

## 2017-10-14 ENCOUNTER — Other Ambulatory Visit: Payer: Self-pay | Admitting: Internal Medicine

## 2017-10-14 DIAGNOSIS — M545 Low back pain: Secondary | ICD-10-CM

## 2017-10-14 DIAGNOSIS — Z6841 Body Mass Index (BMI) 40.0 and over, adult: Secondary | ICD-10-CM | POA: Insufficient documentation

## 2017-11-12 ENCOUNTER — Ambulatory Visit: Payer: Self-pay | Admitting: Psychology

## 2017-11-13 ENCOUNTER — Ambulatory Visit (INDEPENDENT_AMBULATORY_CARE_PROVIDER_SITE_OTHER): Payer: 59 | Admitting: Psychology

## 2017-11-13 DIAGNOSIS — F331 Major depressive disorder, recurrent, moderate: Secondary | ICD-10-CM | POA: Diagnosis not present

## 2017-12-20 IMAGING — US US ABDOMEN LIMITED
1 series · 14 of 25 positions shown · non-contrast
Comparison: None.

CLINICAL DATA: Right upper quadrant pain.

EXAM:
US ABDOMEN LIMITED - RIGHT UPPER QUADRANT

[Series 1: us abdomen limited · 0.28mm/px · 14 of 66 slices shown]
[im 1/66]
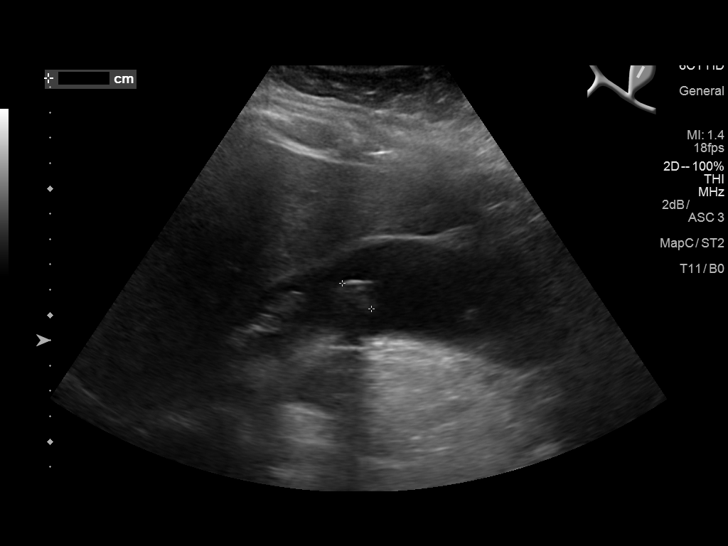
[im 6/66]
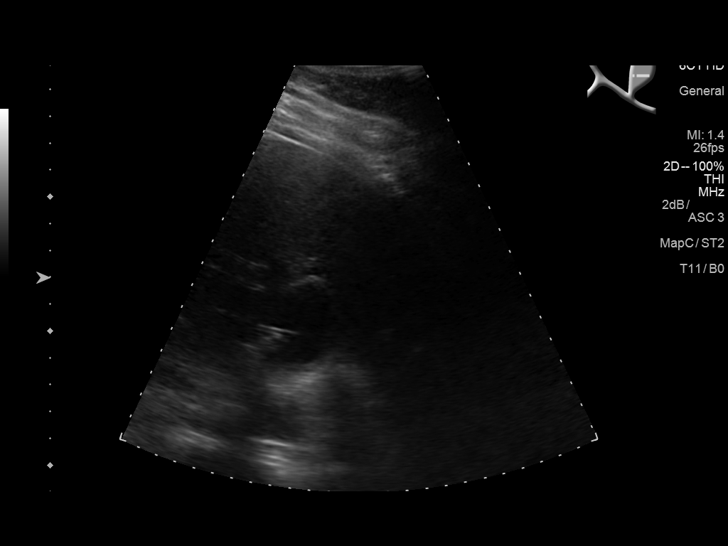
[im 11/66]
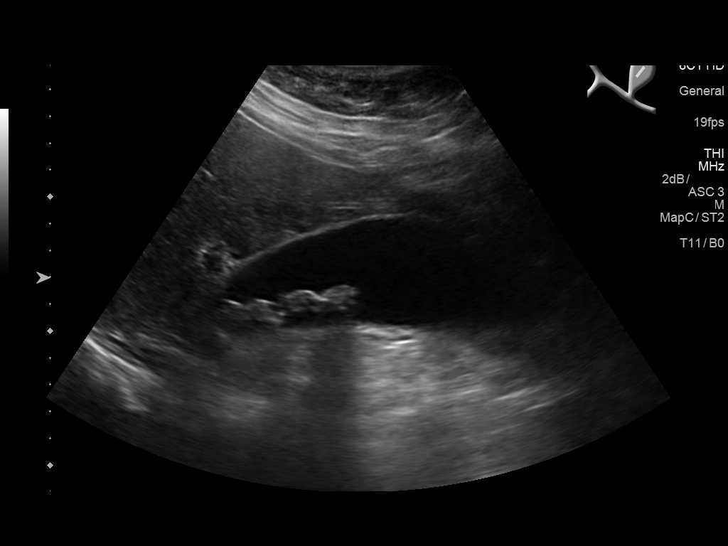
[im 17/66]
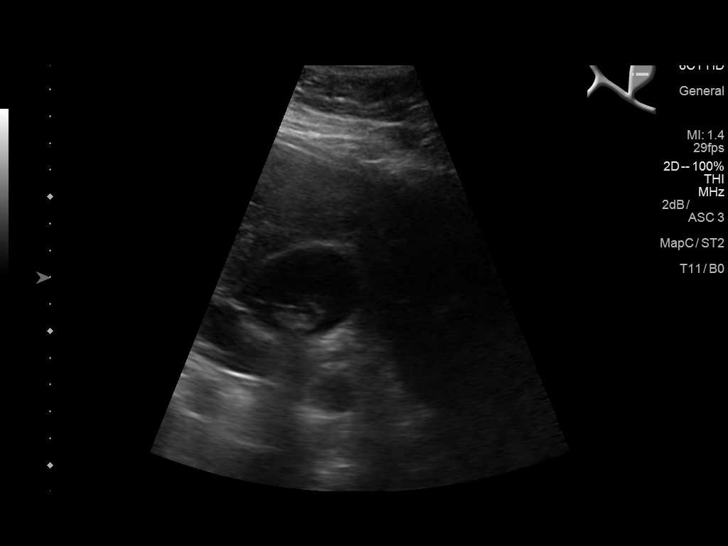
[im 22/66]
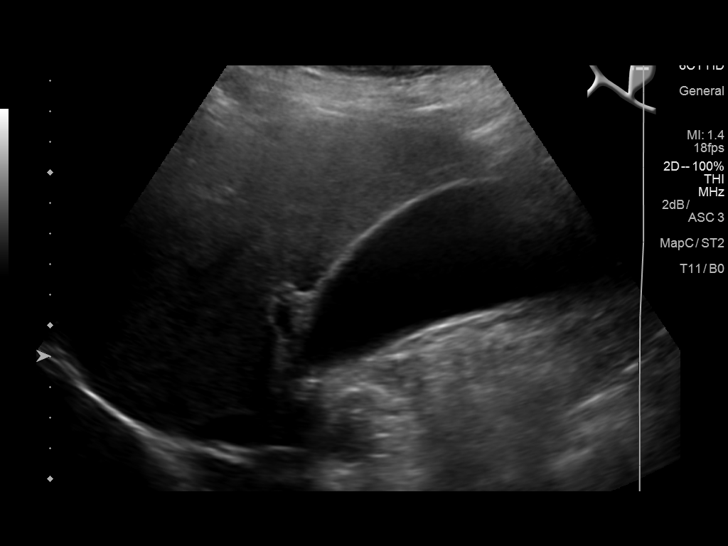
[im 25/66]
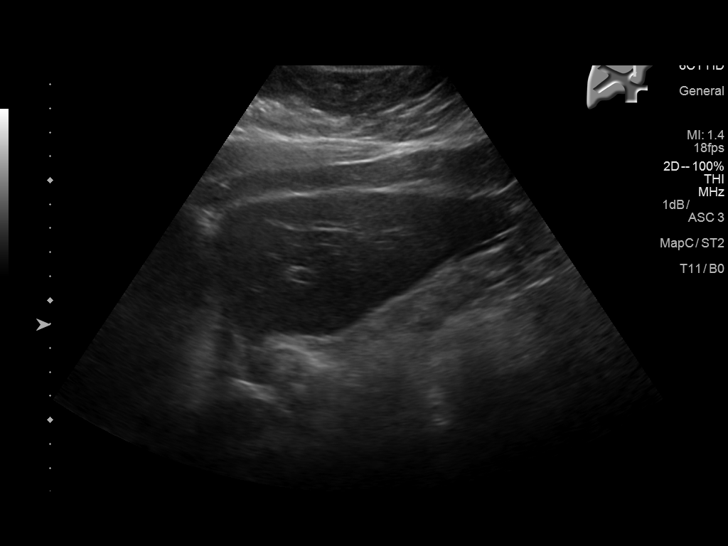
[im 30/66]
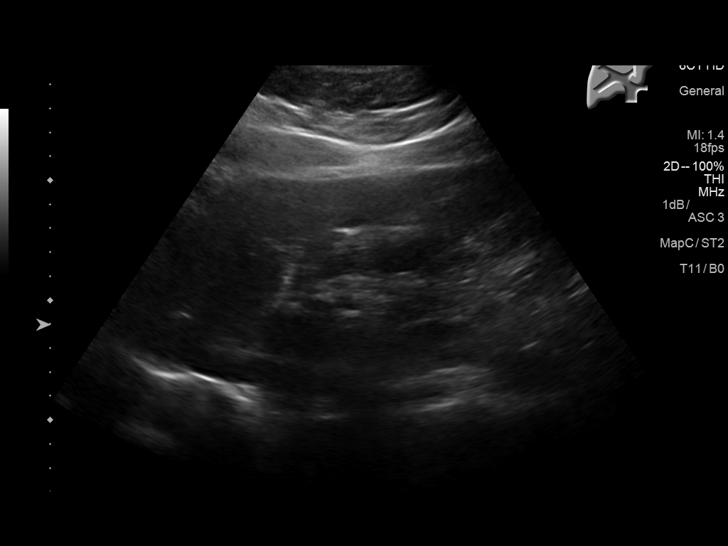
[im 36/66]
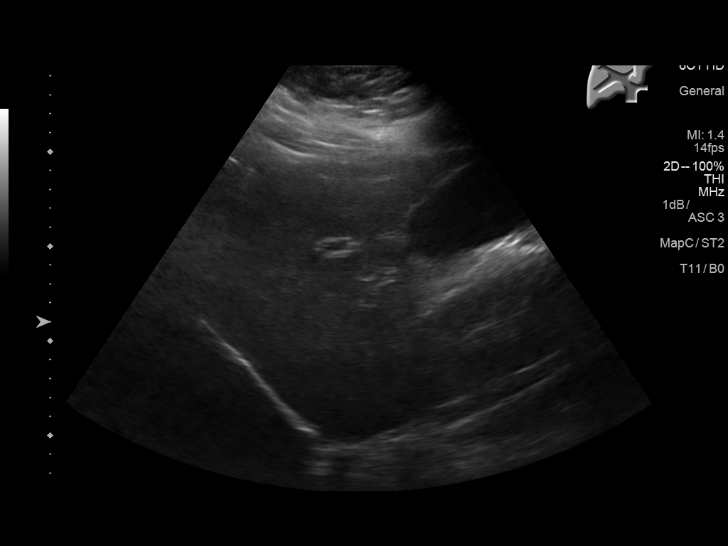
[im 41/66]
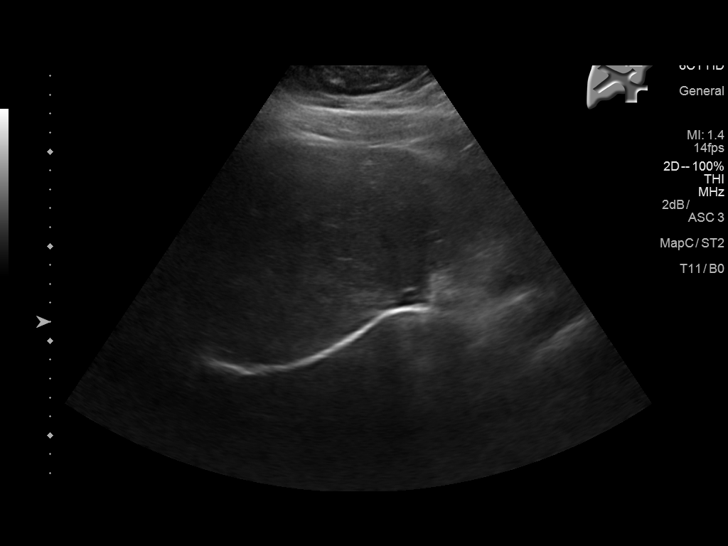
[im 44/66]
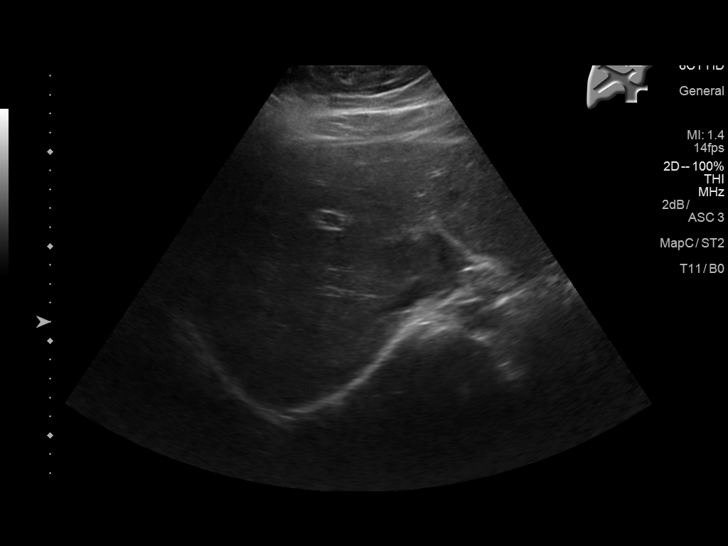
[im 49/66]
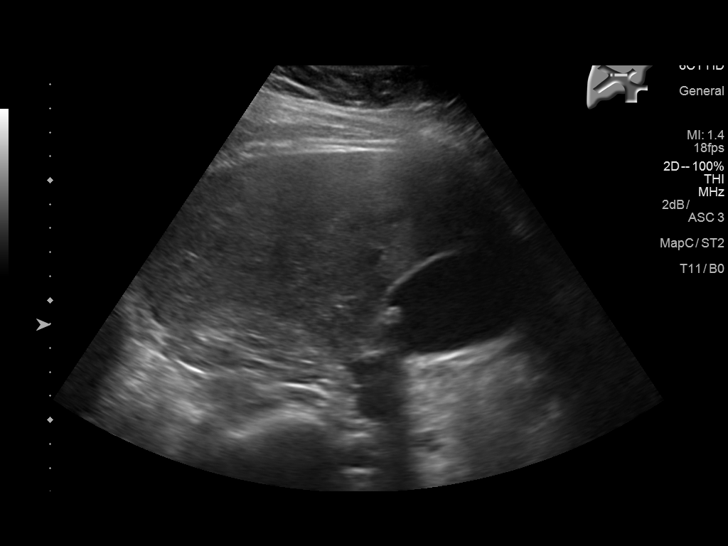
[im 55/66]
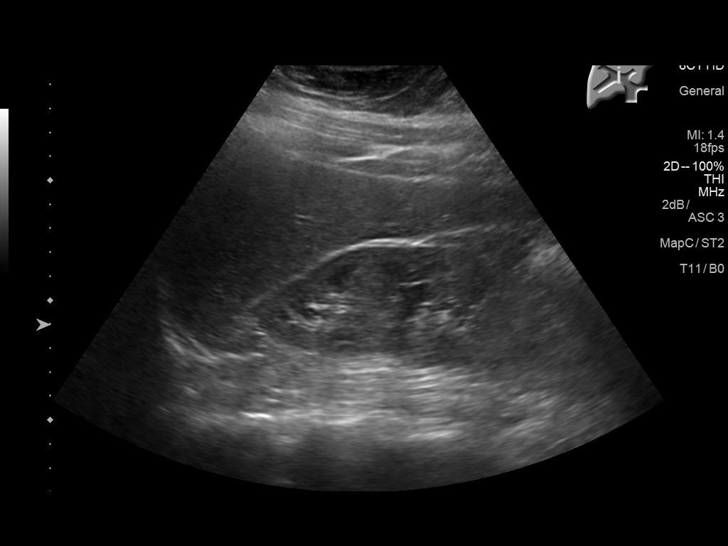
[im 60/66]
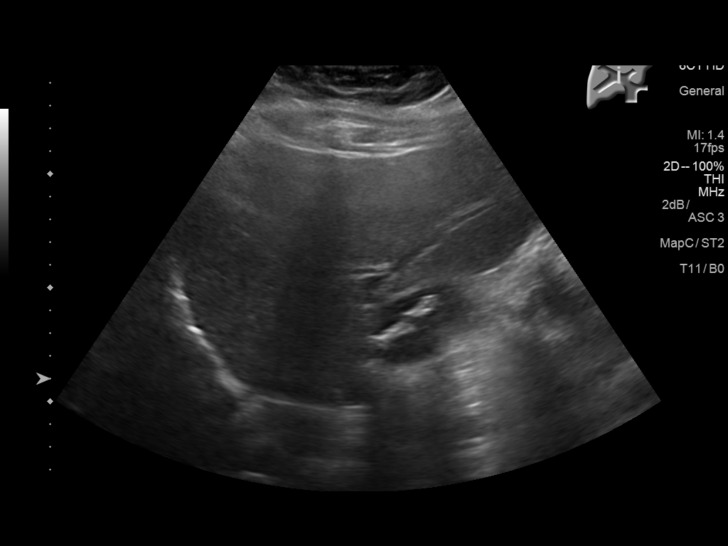
[im 66/66]
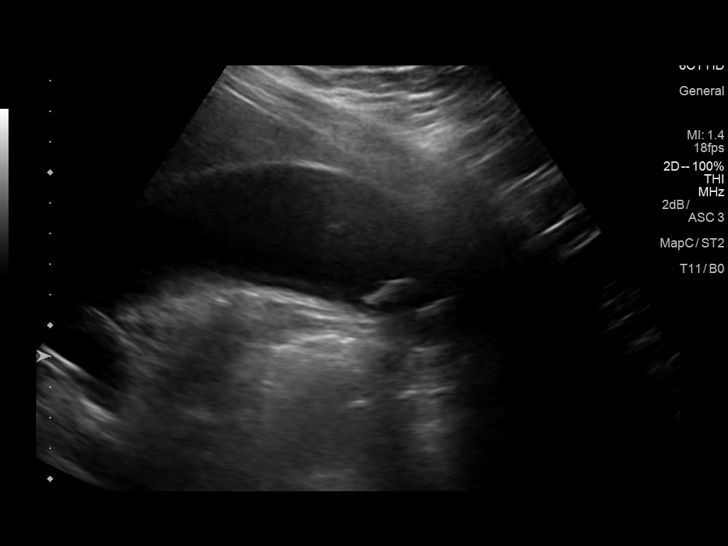

[14 of 25 positions shown; findings below may reference images not displayed]

FINDINGS: Gallbladder:

Multiple mobile gallstones, the largest 1.6 cm. No wall thickening
or sonographic Boldina.

Common bile duct:

Diameter: Normal caliber, 4 mm

Liver:

No focal lesion identified. Within normal limits in parenchymal
echogenicity.
IMPRESSION: Cholelithiasis.  No sonographic evidence of acute cholecystitis.

## 2018-04-01 IMAGING — CR DG LUMBAR SPINE COMPLETE 4+V
5 series · 5 of 5 positions shown · non-contrast
Comparison: None.

CLINICAL DATA: Back pain, weakness

EXAM:
LUMBAR SPINE - COMPLETE 4+ VIEW

[l-spine ap]
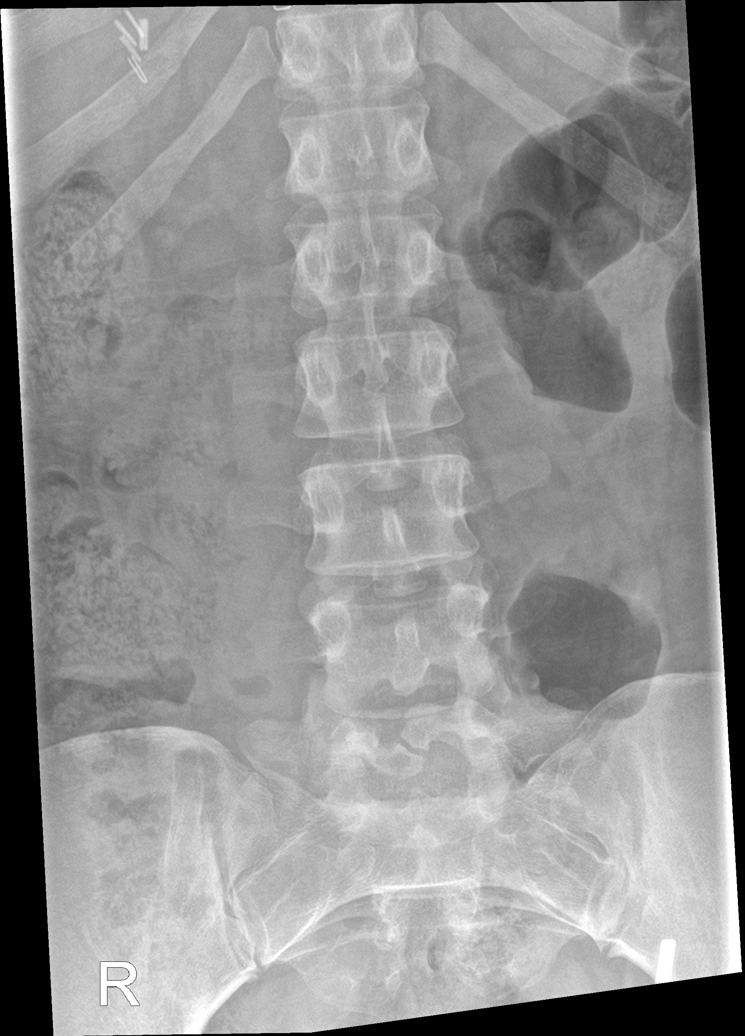

[l-spine obl (1 of 2)]
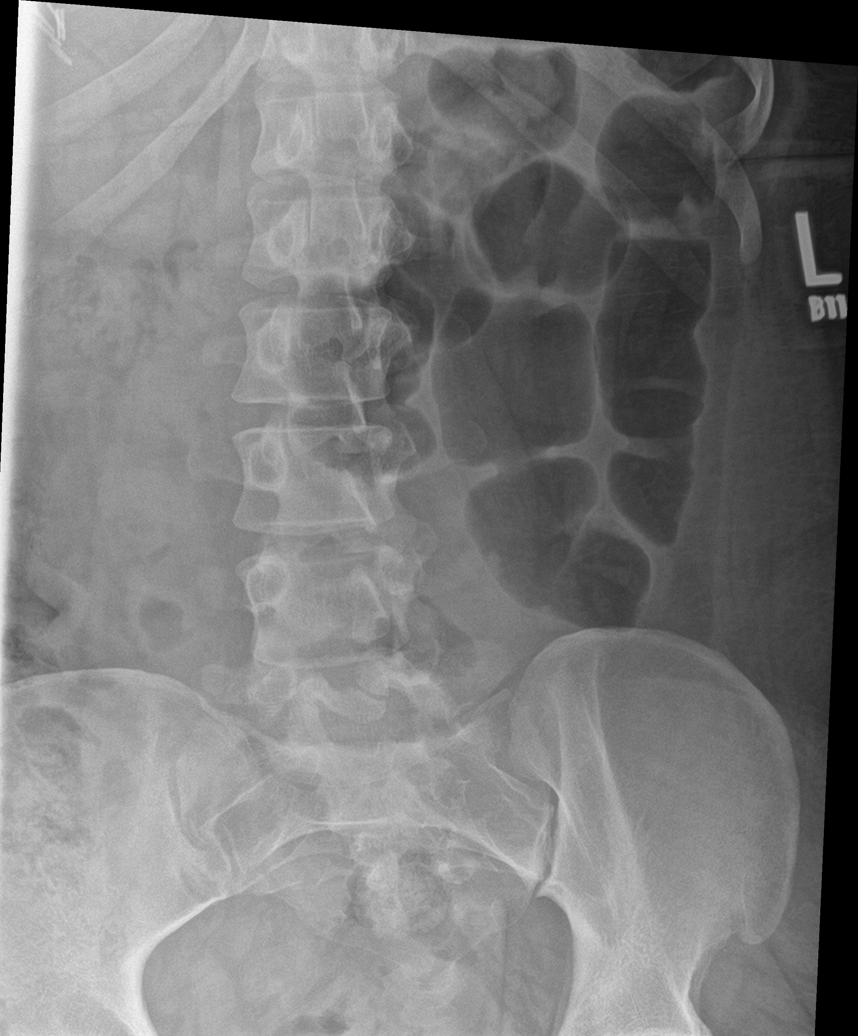

[l-spine obl (2 of 2)]
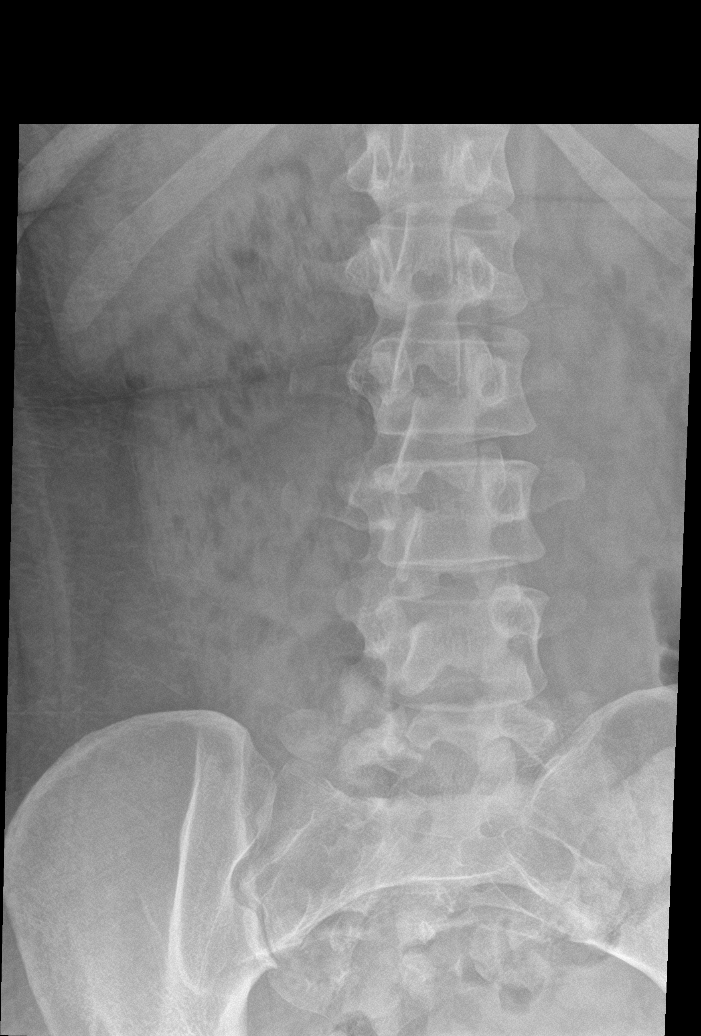

[l-spine lat]
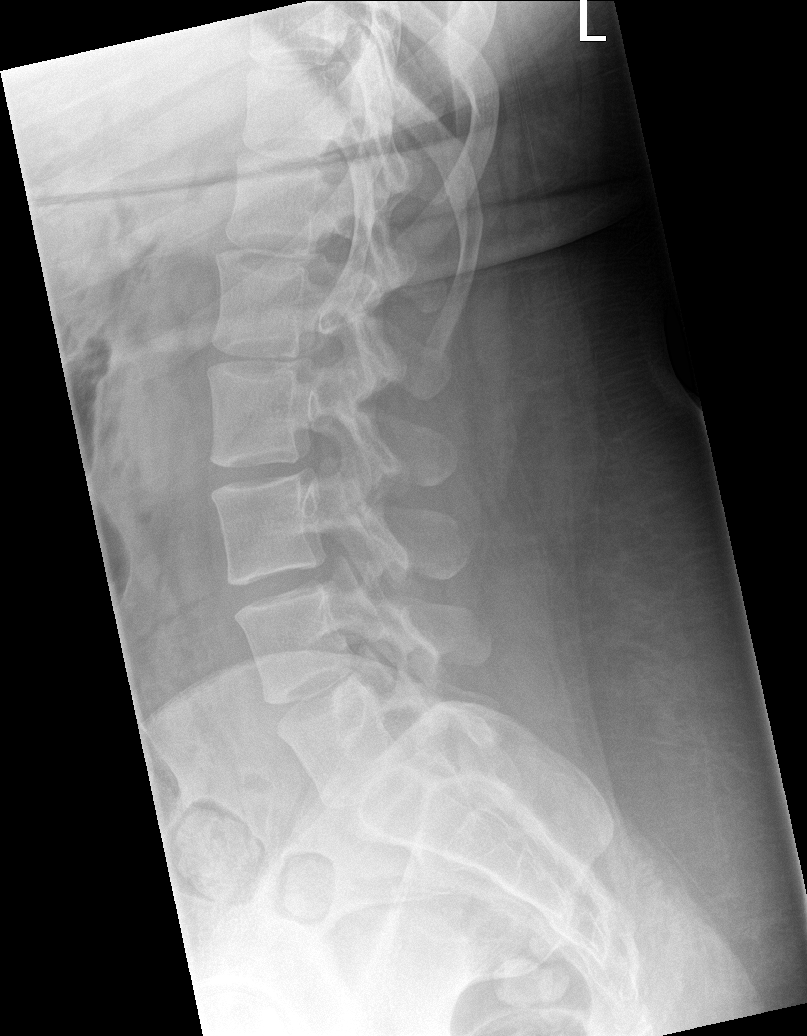

[l-spine spot]
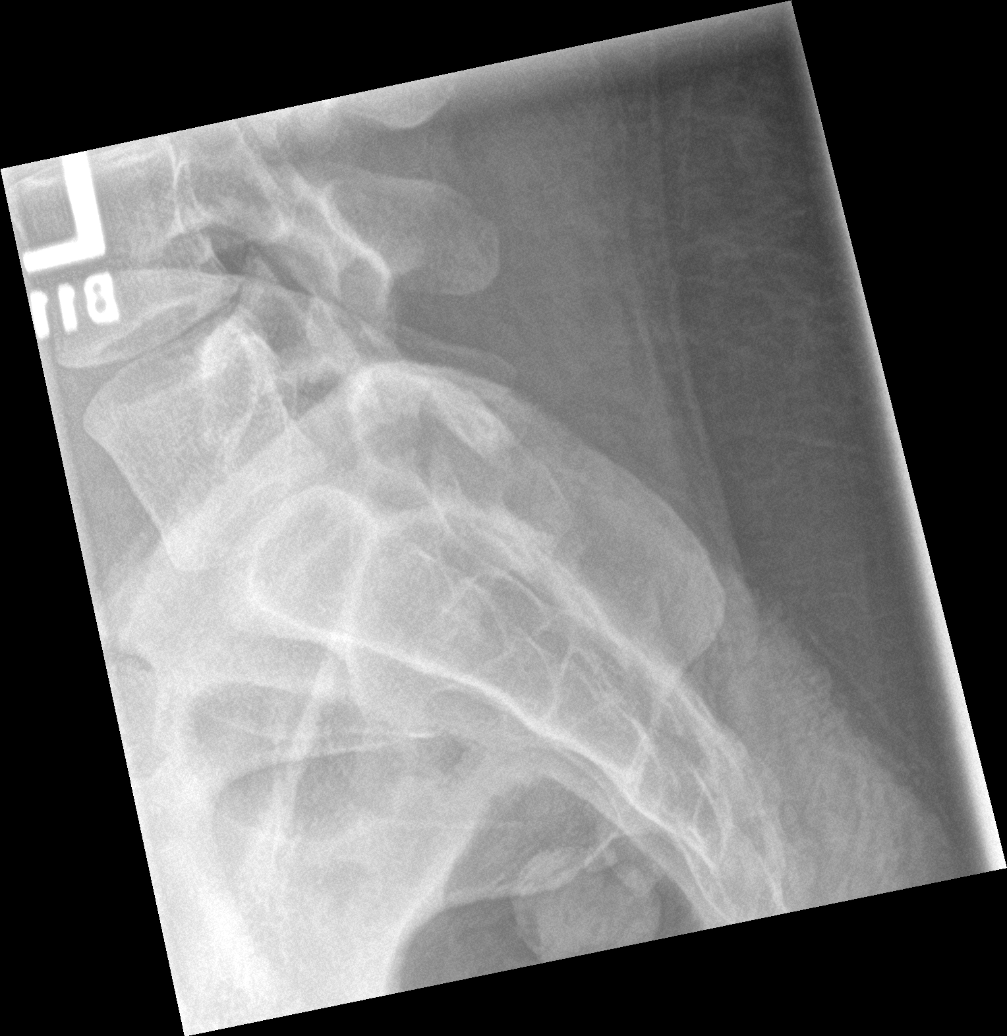

[5 of 5 positions shown; findings below may reference images not displayed]

FINDINGS: Five lumbar-type vertebral bodies.

Normal lumbar lordosis.

No evidence of fracture or dislocation. Vertebral body heights and
intervertebral disc spaces are maintained.

Visualized bony pelvis appears intact.
IMPRESSION: Negative.

## 2018-05-11 ENCOUNTER — Ambulatory Visit (HOSPITAL_COMMUNITY)
Admission: RE | Admit: 2018-05-11 | Discharge: 2018-05-11 | Disposition: A | Discharge status: Home / Self Care | Payer: 59 | Attending: Psychiatry | Admitting: Psychiatry

## 2018-05-11 DIAGNOSIS — F17210 Nicotine dependence, cigarettes, uncomplicated: Secondary | ICD-10-CM | POA: Diagnosis not present

## 2018-05-11 DIAGNOSIS — K219 Gastro-esophageal reflux disease without esophagitis: Secondary | ICD-10-CM | POA: Diagnosis not present

## 2018-05-11 DIAGNOSIS — F410 Panic disorder [episodic paroxysmal anxiety] without agoraphobia: Secondary | ICD-10-CM | POA: Insufficient documentation

## 2018-05-11 DIAGNOSIS — F331 Major depressive disorder, recurrent, moderate: Secondary | ICD-10-CM | POA: Insufficient documentation

## 2018-05-11 DIAGNOSIS — R7303 Prediabetes: Secondary | ICD-10-CM | POA: Diagnosis not present

## 2018-05-12 ENCOUNTER — Inpatient Hospital Stay (HOSPITAL_COMMUNITY)
Admission: RE | Admit: 2018-05-12 | Discharge: 2018-05-19 | DRG: 881 | Disposition: A | Discharge status: Home / Self Care | Payer: 59 | Attending: Psychiatry | Admitting: Psychiatry

## 2018-05-12 ENCOUNTER — Other Ambulatory Visit: Payer: Self-pay

## 2018-05-12 ENCOUNTER — Encounter (HOSPITAL_COMMUNITY): Payer: Self-pay | Admitting: *Deleted

## 2018-05-12 DIAGNOSIS — Z23 Encounter for immunization: Secondary | ICD-10-CM

## 2018-05-12 DIAGNOSIS — K219 Gastro-esophageal reflux disease without esophagitis: Secondary | ICD-10-CM | POA: Diagnosis present

## 2018-05-12 DIAGNOSIS — F411 Generalized anxiety disorder: Secondary | ICD-10-CM | POA: Diagnosis present

## 2018-05-12 DIAGNOSIS — Z91010 Allergy to peanuts: Secondary | ICD-10-CM

## 2018-05-12 DIAGNOSIS — F332 Major depressive disorder, recurrent severe without psychotic features: Secondary | ICD-10-CM | POA: Diagnosis not present

## 2018-05-12 DIAGNOSIS — F329 Major depressive disorder, single episode, unspecified: Principal | ICD-10-CM | POA: Diagnosis present

## 2018-05-12 DIAGNOSIS — Z79899 Other long term (current) drug therapy: Secondary | ICD-10-CM

## 2018-05-12 DIAGNOSIS — R45851 Suicidal ideations: Secondary | ICD-10-CM | POA: Diagnosis present

## 2018-05-12 DIAGNOSIS — Z818 Family history of other mental and behavioral disorders: Secondary | ICD-10-CM

## 2018-05-12 DIAGNOSIS — F1290 Cannabis use, unspecified, uncomplicated: Secondary | ICD-10-CM | POA: Diagnosis not present

## 2018-05-12 DIAGNOSIS — F17210 Nicotine dependence, cigarettes, uncomplicated: Secondary | ICD-10-CM | POA: Diagnosis present

## 2018-05-12 DIAGNOSIS — R45850 Homicidal ideations: Secondary | ICD-10-CM | POA: Diagnosis present

## 2018-05-12 DIAGNOSIS — G4710 Hypersomnia, unspecified: Secondary | ICD-10-CM | POA: Diagnosis present

## 2018-05-12 DIAGNOSIS — Z91018 Allergy to other foods: Secondary | ICD-10-CM | POA: Diagnosis not present

## 2018-05-12 DIAGNOSIS — Z888 Allergy status to other drugs, medicaments and biological substances status: Secondary | ICD-10-CM

## 2018-05-12 DIAGNOSIS — F4000 Agoraphobia, unspecified: Secondary | ICD-10-CM | POA: Diagnosis present

## 2018-05-12 DIAGNOSIS — N390 Urinary tract infection, site not specified: Secondary | ICD-10-CM | POA: Diagnosis present

## 2018-05-12 DIAGNOSIS — F419 Anxiety disorder, unspecified: Secondary | ICD-10-CM | POA: Diagnosis not present

## 2018-05-12 DIAGNOSIS — Z6841 Body Mass Index (BMI) 40.0 and over, adult: Secondary | ICD-10-CM | POA: Diagnosis not present

## 2018-05-12 DIAGNOSIS — G4700 Insomnia, unspecified: Secondary | ICD-10-CM | POA: Diagnosis not present

## 2018-05-12 LAB — RAPID URINE DRUG SCREEN, HOSP PERFORMED
AMPHETAMINES: NOT DETECTED
BARBITURATES: NOT DETECTED
Benzodiazepines: NOT DETECTED
Cocaine: NOT DETECTED
Opiates: NOT DETECTED
TETRAHYDROCANNABINOL: NOT DETECTED

## 2018-05-12 MED ORDER — HYDROXYZINE HCL 50 MG PO TABS
50.0000 mg | ORAL_TABLET | Freq: Three times a day (TID) | ORAL | Status: DC | PRN
  Administered 2018-05-12 – 2018-05-14 (×3): 50 mg via ORAL
  Filled 2018-05-12 (×3): qty 1

## 2018-05-12 MED ORDER — ALUM & MAG HYDROXIDE-SIMETH 200-200-20 MG/5ML PO SUSP: 30.0000 mL | ORAL | Status: DC | PRN

## 2018-05-12 MED ORDER — MAGNESIUM HYDROXIDE 400 MG/5ML PO SUSP: 30.0000 mL | Freq: Every day | ORAL | Status: DC | PRN

## 2018-05-12 NOTE — H&P (Signed)
Behavioral Health Medical Screening Exam  Briana Sexton is an 27 y.o. female. [facility] for racing thought and homicidal  ideations towards her mother. Reports history of depressions. States she is prescribed Lexapro 10 mg, however states she hasn't taken medications as prescribed.  Recently accessed  9/15, however reports worsening symptoms. Will admit to inpatient. Support, encouragement and reassurances was provided   Total Time spent with patient: 30 minutes  Psychiatric Specialty Exam: Physical Exam  Vitals reviewed. Constitutional: She is oriented to person, place, and time. She appears well-developed.  Cardiovascular: Normal rate.  Neurological: She is alert and oriented to person, place, and time.  Psychiatric: She has a normal mood and affect. Her behavior is normal.    Review of Systems  Gastrointestinal: Positive for diarrhea (was reported during intake).  Psychiatric/Behavioral: Positive for depression and suicidal ideas (passive). The patient is nervous/anxious.   All other systems reviewed and are negative.   There were no vitals taken for this visit.There is no height or weight on file to calculate BMI.  General Appearance: Casual  Eye Contact:  Fair  Speech:  Clear and Coherent  Volume:  Normal  Mood:  Anxious and Depressed  Affect:  Depressed and Flat  Thought Process:  Coherent  Orientation:  Full (Time, Place, and Person)  Thought Content:  Hallucinations: None  Suicidal Thoughts:  No  Homicidal Thoughts:  Yes.  with intent/plan  Memory:  Immediate;   Fair Recent;   Fair Remote;   Fair  Judgement:  Fair  Insight:  Fair  Psychomotor Activity:  Normal  Concentration: Concentration: Fair  Recall:  FiservFair  Fund of Knowledge:Fair  Language: Fair  Akathisia:  No  Handed:  Right  AIMS (if indicated):     Assets:  Communication Skills Desire for Improvement Resilience Social Support  Sleep:       Musculoskeletal: Strength & Muscle Tone: within normal  limits Gait & Station: normal Patient leans: N/A  There were no vitals taken for this visit. B/P 122/85 HR 87 temp 98.4 o2 sat 99%  Recommendations:  Based on my evaluation the patient does not appear to have an emergency medical condition.  Briana Rackanika N Athene Schuhmacher, NP 05/12/2018, 2:11 PM

## 2018-05-12 NOTE — BH Assessment (Signed)
Assessment Note  Briana Sexton is an 27 y.o. female who presented at [facility] for the second time in the past 24 hours.  Patient states, "I feel like I am going crazy." Patient states that she returned to [facility] because she did not feel like she could be safe at home.  Patient states that she is depressed, but also hearing voices telling her to hurt other people that she cares about and states that she has been having intrusive thoughts to stab her mother. Patient says that she has no reason to feel anything negative towards her mother.  Patient states that she has no history of violence and is not sure that she would act on these thoughts or not, but states that she is scared to return home at this point because she is not sure what might happen.  Patient states that she was having suicidal thoughts yesterday, but had no plan, but did not indicate that she was suicidal today.  Patient states that she has never been in an inpatient psych unit before, but states that she has been seeing a therapist at [facility].  Patient states that last night  the NP had referred her to IOP at [facility], but she stated that she did not feel like the would be successful at this level of care and felt like she needed a higher level of care.    Patient states that she has cut herself in the past on one occasion at age 5, but states she has never cut herself next.  Patient states that she has been sleeping at least ten hours daily and states that she not been eating well and states that she has lost possibly some weight, but she is not sure how much. Patient states that she smokes marijuana on occasion, but states that she drinks 4 mixed drinks on 1-2 occasions per month.  Patient denies any history of abuse.    Patient currently resides with her mother and states that she has an older sister who lives in the home.  Her sister is 60 years old.  Patient states that she has good relationships with her  mother and her sister. 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  Patient states that she has never been married and states that she has no children.  Patient is alert and oriented.  She is tearful during the assessment and states that she is depressed and anxious.  Patient's memory is intact and her thoughts organized.  Her insight, and judgment as well as her impulse control are slightly impaired.  Her speech is clear and coherent and her eye contact is good. Patient's psycho-motor activity was unremarkable.  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 Abnormal uterine bleeding   . Depression   . GERD (gastroesophageal reflux disease)   . Headache    MIGRAINES  . Pre-diabetes     Past Surgical History:  Procedure Laterality Date  . CHOLECYSTECTOMY N/A 01/30/2017   Procedure: LAPAROSCOPIC CHOLECYSTECTOMY;  Surgeon: Ancil Linsey, MD;  Location: [facility] ORS;  Service: General;  Laterality: N/A;  . NO PAST SURGERIES      Family History:  Family History  Problem Relation Age of Onset  . Hypertension Sister   . Diabetes Maternal Uncle   . Thyroid disease Mother     Social History:  reports that she has  been smoking cigarettes. She has a 1.50 pack-year smoking history. She has never used smokeless tobacco. She reports that she drinks alcohol. She reports that she has current or past drug history. Drug: Marijuana.  Additional Social History:  Alcohol / Drug Use Pain Medications: see MAR Prescriptions: see MAR Over the Counter: see MAR History of alcohol / drug use?: Yes Longest period of sobriety (when/how long): patient has no history of problematic use and states that she has not been drinking or using ion a reguler basis Substance #1 Name of Substance 1: alcohol  1 - Age of First Use: 14 1 - Amount (size/oz): 4 mixed drinks 1 - Frequency: 1  weekend a month 1 - Duration: since onset 1 - Last Use / Amount: last use was [place] Substance #2 Name of Substance 2: cannabis 2 - Age of First Use: unknown 2 - Amount (size/oz): undetermined amount 2 - Frequency: occasionally 2 - Duration: since onset(since onset) 2 - Last Use / Amount: undetermine amount  CIWA:   COWS:    Allergies:  Allergies  Allergen Reactions  . Corn-Containing Products Nausea And Vomiting and Swelling  . Dairy Aid [Lactase] Nausea And Vomiting  . Peanut-Containing Drug Products Hives and Swelling    Home Medications:  Medications Prior to Admission  Medication Sig Dispense Refill  . cetirizine (ZYRTEC) 10 MG tablet Take 1 tablet (10 mg total) by mouth daily. (Patient not taking: Reported on 10/09/2017) 15 tablet 0  . escitalopram (LEXAPRO) 10 MG tablet Take 10 mg by mouth daily.    Marland Kitchen etonogestrel (NEXPLANON) 68 MG IMPL implant 1 each by Subdermal route once.    Marland Kitchen HYDROcodone-acetaminophen (NORCO/VICODIN) 5-325 MG tablet Take 1 tablet by mouth every 6 (six) hours as needed.  0  . ibuprofen (ADVIL,MOTRIN) 600 MG tablet Take 1 tablet by mouth three times daily with meals (Patient not taking: Reported on 10/09/2017) 15 tablet 0  . metoCLOPramide (REGLAN) 10 MG tablet Take 1 tablet (10 mg total) by mouth every 8 (eight) hours as needed for nausea. 20 tablet 0  . metroNIDAZOLE (FLAGYL) 500 MG tablet Take 1 tablet (500 mg total) by mouth 2 (two) times daily. 14 tablet 0  . nitrofurantoin, macrocrystal-monohydrate, (MACROBID) 100 MG capsule Take 1 capsule (100 mg total) by mouth 2 (two) times daily. (Patient not taking: Reported on 10/09/2017) 14 capsule 0  . norgestimate-ethinyl estradiol (ORTHO-CYCLEN,SPRINTEC,PREVIFEM) 0.25-35 MG-MCG tablet Take 1 tablet by mouth daily. (Patient not taking: Reported on 10/09/2017) 3 Package 4  . ondansetron (ZOFRAN) 4 MG tablet Take 4 mg by mouth daily as needed.      OB/GYN Status:  No LMP recorded. Patient has had an  implant.  General Assessment Data Location of Assessment: [facility] TTS Assessment: In system Is this a Tele or Face-to-Face Assessment?: Face-to-Face Is this an Initial Assessment or a Re-assessment for this encounter?: Initial Assessment Patient Accompanied by:: N/A Language Other than English: No Living Arrangements: Other (Comment)(with mother) What gender do you identify as?: Female Marital status: Single Maiden name: [place]joy) Pregnancy Status: No Living Arrangements: Parent Can pt return to current living arrangement?: Yes Admission Status: Voluntary Is patient capable of signing voluntary admission?: Yes Referral Source: Self/Family/Friend Insurance type: [facility])     Crisis Care Plan Living Arrangements: Parent Name of Psychiatrist: ([facility]) Name of Therapist: Evalina Field  Education Status Is patient currently in school?: No Is the patient employed, unemployed or receiving disability?: Employed  Risk to self with the past  6 months Suicidal Ideation: No Has patient been a risk to self within the past 6 months prior to admission? : No Suicidal Intent: No Has patient had any suicidal intent within the past 6 months prior to admission? : No Is patient at risk for suicide?: No Suicidal Plan?: No Has patient had any suicidal plan within the past 6 months prior to admission? : No Access to Means: No What has been your use of drugs/alcohol within the last 12 months?: alcohol and THC Previous Attempts/Gestures: Yes How many times?: (cut self at age 27) Other Self Harm Risks: (none reported) Triggers for Past Attempts: Family contact Intentional Self Injurious Behavior: Cutting Comment - Self Injurious Behavior: (cut one time at age 89 and never again) Family Suicide History: Unknown Recent stressful life event(s): (none) Persecutory voices/beliefs?: No Depression: Yes Depression Symptoms: Despondent, Insomnia, Tearfulness,  Isolating, Guilt, Loss of interest in usual pleasures, Feeling worthless/self pity Substance abuse history and/or treatment for substance abuse?: Yes Suicide prevention information given to non-admitted patients: Not applicable  Risk to Others within the past 6 months Homicidal Ideation: Yes-Currently Present Does patient have any lifetime risk of violence toward others beyond the six months prior to admission? : No Thoughts of Harm to Others: Yes-Currently Present Comment - Thoughts of Harm to Others: (thoughts to stab mother) Current Homicidal Intent: No Current Homicidal Plan: Yes-Currently Present Describe Current Homicidal Plan: (thoughts to stab mother) Access to Homicidal Means: Yes Describe Access to Homicidal Means: (kitchen knives) Identified Victim: (mother) History of harm to others?: Yes Assessment of Violence: None Noted Violent Behavior Description: (none reported) Does patient have access to weapons?: No Criminal Charges Pending?: No Does patient have a court date: No Is patient on probation?: No  Psychosis Hallucinations: Auditory Delusions: (none)  Mental Status Report Appearance/Hygiene: Unremarkable Eye Contact: Good Motor Activity: Unremarkable Speech: Logical/coherent Level of Consciousness: Alert Mood: Depressed, Anxious Affect: Anxious, Depressed Anxiety Level: Severe Thought Processes: Coherent Judgement: Impaired Orientation: Person, Place, Time, Situation Obsessive Compulsive Thoughts/Behaviors: None  Cognitive Functioning Concentration: Decreased Memory: Recent Intact, Remote Intact Is patient IDD: No Insight: Fair Impulse Control: Fair Appetite: Fair Have you had any weight changes? : No Change  ADLScreening [facility]) Patient's cognitive ability adequate to safely complete daily activities?: Yes Patient able to express need for assistance with ADLs?: Yes Independently performs ADLs?: Yes (appropriate for developmental  age)  Prior Inpatient Therapy Prior Inpatient Therapy: No  Prior Outpatient Therapy Prior Outpatient Therapy: Yes Prior Therapy Dates: (active) Prior Therapy Facilty/Provider(s): ([facility]) Reason for Treatment: (depression and anxiety) Does patient have an ACCT team?: No Does patient have Intensive In-House Services?  : No Does patient have [facility] services? : No Does patient have P4CC services?: No  ADL Screening (condition at time of admission) Patient's cognitive ability adequate to safely complete daily activities?: Yes Is the patient deaf or have difficulty hearing?: No Does the patient have difficulty seeing, even when wearing glasses/contacts?: No Does the patient have difficulty concentrating, remembering, or making decisions?: No Patient able to express need for assistance with ADLs?: Yes Does the patient have difficulty dressing or bathing?: No Independently performs ADLs?: Yes (appropriate for developmental age) Does the patient have difficulty walking or climbing stairs?: No Weakness of Legs: None Weakness of Arms/Hands: None  Home Assistive Devices/Equipment Home Assistive Devices/Equipment: None  Therapy Consults (therapy consults require a physician order) PT Evaluation Needed: No OT Evalulation Needed: No Abuse/Neglect Assessment (Assessment to be complete while patient is alone) Abuse/Neglect Assessment  Can Be Completed: Yes Physical Abuse: Denies Verbal Abuse: Denies Sexual Abuse: Denies Exploitation of patient/patient's resources: Denies Self-Neglect: Denies Values / Beliefs Cultural Requests During Hospitalization: None Spiritual Requests During Hospitalization: None Consults Spiritual Care Consult Needed: No Social Work Consult Needed: No Merchant navy officer (For Healthcare) Does Patient Have a Medical Advance Directive?: No Would patient like information on creating a medical advance directive?: No - Patient declined Nutrition Screen- [facility]  Adult/WL/AP Has the patient recently lost weight without trying?: Yes, 2-13 lbs. Has the patient been eating poorly because of a decreased appetite?: Yes Malnutrition Screening Tool Score: 2        Disposition: Per Fransisca Kaufmann,  NP/ Hillery Jacks, NP Patient meets inpatient admission criteria Disposition Initial Assessment Completed for this Encounter: Yes Disposition of Patient: Admit Type of inpatient treatment program: Adult  On Site Evaluation by:   Reviewed with Physician:    Arnoldo Lenis Wanda Cellucci 05/12/2018 2:23 PM

## 2018-05-12 NOTE — Tx Team (Signed)
Initial Treatment Plan 05/12/2018 4:02 PM Briana Sexton UJW:[id number]    PATIENT STRESSORS: Marital or family conflict Medication change or noncompliance Occupational concerns   PATIENT STRENGTHS: Ability for insight Average or above average intelligence Capable of independent living General fund of knowledge Supportive family/friends   PATIENT IDENTIFIED PROBLEMS: Depression Suicidal thoughts "work and family issues"                     DISCHARGE CRITERIA:  Ability to meet basic life and health needs Improved stabilization in mood, thinking, and/or behavior Reduction of life-threatening or endangering symptoms to within safe limits Verbal commitment to aftercare and medication compliance  PRELIMINARY DISCHARGE PLAN: Attend aftercare/continuing care group Return to previous living arrangement  PATIENT/FAMILY INVOLVEMENT: This treatment plan has been presented to and reviewed with the patient, Briana Sexton, and/or family member, .  The patient and family have been given the opportunity to ask questions and make suggestions.  Eilam Shrewsbury, [place], [place]RN 05/12/2018, 4:02 PM
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
## 2018-05-12 NOTE — Progress Notes (Signed)
Briana Sexton is a 27 year old female pt admitted on voluntary basis after presenting as a walk-in. On admission, she reports depression, anxiety, feeling suicidal and reports that she feels unsafe going home. She reports that she has been prescribed Lexapro but reports she really does not take it as prescribed. She does endorse occasional alcohol usage but denies any other substance abuse issue. She reports that she has never been hospitalized inpatient before. She reports that she lives with her sister and reports that she will return there once she is discharged. [name] was escorted to the unit, oriented to the milieu and safety maintained.

## 2018-05-12 NOTE — H&P (Signed)
Behavioral Health Medical Screening Exam  Briana Sexton is an 27 y.o. female.single female. [facility] Pt was accompanied by friend, Freeman CaldronOlivia Horne and a support dog. Pt reports that she has had an increase in depression, anxiety, and obsessive-compulsive symptoms. Pt reports being tearful, sleeping more than usual, being less irritable (out of character), feelings of guilt, hopelessness and helplessness. Pt reports isolation as well. Pt reports no precipitant to symptoms. Pt reports fleeting thoughts of suicide and homicide. Pt reports that she has thoughts of wanting to harm her mother, but stated that she has no plan or intent of harming her mother. Pt stated that she is disturbed by the thoughts of self harm and harming her mother. Pt reports hx of cutting herself at age 27. Pt denies current self harm, SI/HI or SA. Pt denies AH/VH and delusions. Pt reports being on Escitalopram previously, and taking it briefly for 3 days, after a period of not taking it for over one year. Pt reports [facility], but reports not being seen since March 2019. Pt denies hx of IP.  Total Time spent with patient: 20 minutes  Psychiatric Specialty Exam: Physical Exam  Constitutional: She is oriented to person, place, and time. She appears well-developed and well-nourished. No distress.  HENT:  Head: Normocephalic.  Eyes: Pupils are equal, round, and reactive to light.  Respiratory: Effort normal and breath sounds normal. No respiratory distress.  Neurological: She is alert and oriented to person, place, and time. No cranial nerve deficit.  Skin: Skin is warm and dry. She is not diaphoretic.  Psychiatric: Her speech is normal. Judgment normal. Her affect is labile. She is withdrawn. Cognition and memory are normal. She exhibits a depressed mood. She expresses no homicidal and no suicidal ideation. She expresses no suicidal plans and no homicidal plans.     Review of Systems  Constitutional: Negative for chills and fever.  Respiratory: Negative for shortness of breath.   Cardiovascular: Negative for chest pain and palpitations.  Gastrointestinal: Negative for heartburn and nausea.  Neurological: Negative for focal weakness, seizures and loss of consciousness.  Psychiatric/Behavioral: Positive for depression. Negative for hallucinations, substance abuse and suicidal ideas. The patient is nervous/anxious.     There were no vitals taken for this visit.There is no height or weight on file to calculate BMI.  General Appearance: Casual  Eye Contact:  Good  Speech:  Clear and Coherent  Volume:  Normal  Mood:  Depressed  Affect:  Congruent  Thought Process:  Goal Directed  Orientation:  Full (Time, Place, and Person)  Thought Content:  Logical  Suicidal Thoughts:  No  Homicidal Thoughts:  No  Memory:  Immediate;   Good  Judgement:  Fair  Insight:  Fair  Psychomotor Activity:  Normal  Concentration: Concentration: Fair  Recall:  Fair  Fund of Knowledge:Fair  Language: Fair  Akathisia:  Negative  Handed:  Right  AIMS (if indicated):     Assets:  Desire for Improvement  Sleep:       Musculoskeletal: Strength & Muscle Tone: within normal limits Gait & Station: normal Patient leans: N/A  There were no vitals taken for this visit.  Recommendations:  Based on my evaluation the patient does not appear to have an emergency medical condition.  Kerry HoughSpencer E Arturo Freundlich, PA-C 05/12/2018, 4:28 AM

## 2018-05-13 DIAGNOSIS — F419 Anxiety disorder, unspecified: Secondary | ICD-10-CM

## 2018-05-13 DIAGNOSIS — R45850 Homicidal ideations: Secondary | ICD-10-CM

## 2018-05-13 DIAGNOSIS — F332 Major depressive disorder, recurrent severe without psychotic features: Secondary | ICD-10-CM

## 2018-05-13 LAB — CBC WITH DIFFERENTIAL/PLATELET
BASOS ABS: 0 10*3/uL (ref 0.0–0.1)
Basophils Relative: 0 %
Eosinophils Absolute: 0.1 10*3/uL (ref 0.0–0.7)
Eosinophils Relative: 1 %
HEMATOCRIT: 37.9 % (ref 36.0–46.0)
Hemoglobin: 12.8 g/dL (ref 12.0–15.0)
LYMPHS PCT: 25 %
Lymphs Abs: 2.6 10*3/uL (ref 0.7–4.0)
MCH: 29.8 pg (ref 26.0–34.0)
MCHC: 33.8 g/dL (ref 30.0–36.0)
MCV: 88.1 fL (ref 78.0–100.0)
MONO ABS: 0.5 10*3/uL (ref 0.1–1.0)
Monocytes Relative: 5 %
NEUTROS ABS: 7.1 10*3/uL (ref 1.7–7.7)
Neutrophils Relative %: 69 %
Platelets: 373 10*3/uL (ref 150–400)
RBC: 4.3 MIL/uL (ref 3.87–5.11)
RDW: 12.9 % (ref 11.5–15.5)
WBC: 10.4 10*3/uL (ref 4.0–10.5)

## 2018-05-13 LAB — LIPID PANEL
CHOL/HDL RATIO: 4.5 ratio
Cholesterol: 147 mg/dL (ref 0–200)
HDL: 33 mg/dL — AB (ref >40)
LDL CALC: 87 mg/dL (ref 0–99)
Triglycerides: 135 mg/dL (ref <150)
VLDL: 27 mg/dL (ref 0–40)

## 2018-05-13 LAB — URINALYSIS, COMPLETE (UACMP) WITH MICROSCOPIC
Bilirubin Urine: NEGATIVE
GLUCOSE, UA: NEGATIVE mg/dL
Ketones, ur: NEGATIVE mg/dL
Nitrite: NEGATIVE
PH: 6 (ref 5.0–8.0)
Protein, ur: NEGATIVE mg/dL
SPECIFIC GRAVITY, URINE: 1.015 (ref 1.005–1.030)

## 2018-05-13 LAB — RAPID URINE DRUG SCREEN, HOSP PERFORMED
AMPHETAMINES: NOT DETECTED
BARBITURATES: NOT DETECTED
BENZODIAZEPINES: NOT DETECTED
Cocaine: NOT DETECTED
Opiates: NOT DETECTED
TETRAHYDROCANNABINOL: NOT DETECTED

## 2018-05-13 LAB — BASIC METABOLIC PANEL
Anion gap: 8 (ref 5–15)
BUN: 10 mg/dL (ref 6–20)
CO2: 26 mmol/L (ref 22–32)
Calcium: 9 mg/dL (ref 8.9–10.3)
Chloride: 104 mmol/L (ref 98–111)
Creatinine, Ser: 0.83 mg/dL (ref 0.44–1.00)
GFR calc Af Amer: >60 mL/min (ref >60)
GFR calc non Af Amer: >60 mL/min (ref >60)
GLUCOSE: 121 mg/dL — AB (ref 70–99)
Potassium: 4 mmol/L (ref 3.5–5.1)
SODIUM: 138 mmol/L (ref 135–145)

## 2018-05-13 LAB — PREGNANCY, URINE: Preg Test, Ur: NEGATIVE

## 2018-05-13 LAB — TSH: TSH: 1.007 u[IU]/mL (ref 0.350–4.500)

## 2018-05-13 MED ORDER — LORAZEPAM 0.5 MG PO TABS
0.5000 mg | ORAL_TABLET | Freq: Four times a day (QID) | ORAL | Status: DC | PRN
  Administered 2018-05-13 – 2018-05-17 (×4): 0.5 mg via ORAL
  Filled 2018-05-13 (×4): qty 1

## 2018-05-13 MED ORDER — ESCITALOPRAM OXALATE 10 MG PO TABS
10.0000 mg | ORAL_TABLET | Freq: Every day | ORAL | Status: DC
  Administered 2018-05-13 – 2018-05-19 (×7): 10 mg via ORAL
  Filled 2018-05-13 (×10): qty 1

## 2018-05-13 NOTE — Progress Notes (Signed)
D: Pt  Passive HI / AH denies SI/VH. Pt is pleasant and cooperative. Pt stated she thought she might have UTI. Pt said she has been having intrusive thoughts that seem to be obsessive . Pt stated she have had the thoughts in the past but were infrequent . Pt stated in past few weeks the thoughts seem to be more constant.  A: Pt was offered support and encouragement. Pt was given scheduled medications. Pt was encourage to attend groups. Q 15 minute checks were done for safety.  R:Pt attends groups and interacts well with peers and staff. Pt is taking medication. Pt has no complaints.Pt receptive to treatment and safety maintained on unit.  Problem: Activity: Goal: Sleeping patterns will improve Outcome: Progressing   Problem: Safety: Goal: Periods of time without injury will increase Outcome: Progressing

## 2018-05-13 NOTE — Progress Notes (Signed)
Adult Psychoeducational Group Note  Date:  05/13/2018 Time:  1:33 AM  Group Topic/Focus:  Wrap-Up Group:   The focus of this group is to help patients review their daily goal of treatment and discuss progress on daily workbooks.  Participation Level:  Did Not Attend  Participation Quality:  Did not attend  Affect:  Did not attend  Cognitive:  Did not attend  Insight: None  Engagement in Group:  Did not attend  Modes of Intervention:  Did not attend  Additional Comments:  Did not attend evening wrap up group.  Briana FurnaceChristopher  [name] 05/13/2018, 1:33 AM

## 2018-05-13 NOTE — [facility] Group Notes (Signed)
LCSW Group Therapy Note 05/13/2018 12:03 PM  Type of Therapy/Topic: Group Therapy: Feelings about Diagnosis  Participation Level: Active   Description of Group:  This group will allow patients to explore their thoughts and feelings about diagnoses they have received. Patients will be guided to explore their level of understanding and acceptance of these diagnoses. Facilitator will encourage patients to process their thoughts and feelings about the reactions of others to their diagnosis and will guide patients in identifying ways to discuss their diagnosis with significant others in their lives. This group will be process-oriented, with patients participating in exploration of their own experiences, giving and receiving support, and processing challenge from other group members.  Therapeutic Goals: 1. Patient will demonstrate understanding of diagnosis as evidenced by identifying two or more symptoms of the disorder 2. Patient will be able to express two feelings regarding the diagnosis 3. Patient will demonstrate their ability to communicate their needs through discussion and/or role play  Summary of Patient Progress:  Briana Sexton was engaged and participated throughout the group session. [name] reports that she feels "a part of me is restricted, because you cant always trust telling people you struggle with mental illness". [name] reports that it is a process to accept the fact that you have a mental illness.     Therapeutic Modalities:  Cognitive Behavioral Therapy Brief Therapy Feelings Identification    [name] LCSWA Clinical Social Worker
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
## 2018-05-13 NOTE — Progress Notes (Addendum)
Data: Patient presents pleasant and cooperative this morning. Patient reports sleeping well overnight but some anxiety this morning after breakfast. Complaint with newly scheduled medications, which this Clinical research associatewriter reviewed with patient. Patient denies pain/physical complaints. Patient completed self-inventory sheet and rates depression, hopelessness, and anxiety between 2-7,  5, and 5 respectively. Patient rates both their sleep and appetite as good. Patient states goal for today is to "figure out what's stressing me out". Patient currently denies SI/HI/AVH. Patient reports she will report need for PRN medications to writer and denies need for any this morning.  Action: Patient was educated about and provided medication per provider's orders. Patient safety maintained with q15 min safety checks and frequent rounding. Low fall risk precautions in place. Emotional support given. 1:1 interaction and active listening provided. Patient encouraged to attend meals, groups, and work on treatment plan and goals. Labs, vital signs and patient behavior monitored throughout shift.   Response: Patient remains safe on the unit at this time and agrees to come to staff with any issues/concerns. Patient is interacting with peers appropriately on the unit. Will continue to support and monitor.

## 2018-05-13 NOTE — [facility] Suicide Risk Assessment (Signed)
[facility]   Nursing information obtained from:  Patient Demographic factors:  Adolescent or young adult Current Mental Status:  Suicidal ideation indicated by patient, Self-harm thoughts Loss Factors:  NA Historical Factors:  Prior suicide attempts, Family history of mental illness or substance abuse Risk Reduction Factors:  Living with another person, especially a relative, Positive coping skills or problem solving skills  Total Time spent with patient: 45 minutes Principal Problem:  MDD Diagnosis:   Patient Active Problem List   Diagnosis Date Noted  . MDD (major depressive disorder) [F32.9] 05/12/2018  . Tobacco use [Z72.0] 10/09/2017  . Morbid obesity (HCC) [E66.01] 10/09/2017  . Calculus of gallbladder without cholecystitis without obstruction [K80.20]    Subjective Data:   Continued Clinical Symptoms:  Alcohol Use Disorder Identification Test Final Score (AUDIT): 3 The "Alcohol Use Disorders Identification Test", Guidelines for Use in Primary Care, Second Edition.  World Science [employer] [facility](WHO). Score between 0-7:  no or low risk or alcohol related problems. Score between 8-15:  moderate risk of alcohol related problems. Score between 16-19:  high risk of alcohol related problems. Score 20 or above:  warrants further diagnostic evaluation for alcohol dependence and treatment.   CLINICAL FACTORS:  27 year old female, presented due to violent ideations of stabbing mother- describes these as ego dystonic, obsessive, and denies history of violence. Endorses depression, neuro-vegetative symptoms of depression    Psychiatric Specialty Exam: Physical Exam  ROS  Blood pressure 109/89, pulse 84, temperature 98.5 F (36.9 C), resp. rate 20, height 5\' 6"  (1.676 m), weight 117.9 kg.Body mass index is 41.97 kg/m.   see admit note MSE   COGNITIVE FEATURES THAT CONTRIBUTE TO RISK:  Closed-mindedness and Loss of executive function    SUICIDE RISK:    Moderate:  Frequent suicidal ideation with limited intensity, and duration, some specificity in terms of plans, no associated intent, good self-control, limited dysphoria/symptomatology, some risk factors present, and identifiable protective factors, including available and accessible social support.  PLAN OF CARE: Patient will be admitted to inpatient psychiatric unit for stabilization and safety. Will provide and encourage milieu participation. Provide medication management and maked adjustments as needed.  Will follow daily.    I certify that inpatient services furnished can reasonably be expected to improve the patient's condition.   Craige CottaFernando A Cobos, MD 05/13/2018, 9:23 AM

## 2018-05-13 NOTE — Progress Notes (Signed)
Adult Psychoeducational Group Note  Date:  05/13/2018 Time:  9:41 PM  Group Topic/Focus:  Wrap-Up Group:   The focus of this group is to help patients review their daily goal of treatment and discuss progress on daily workbooks.  Participation Level:  Active  Participation Quality:  Appropriate  Affect:  Appropriate  Cognitive:  Appropriate  Insight: Appropriate  Engagement in Group:  Engaged  Modes of Intervention:  Discussion  Additional Comments:  Pt goal for today was to go outside with her peers to exercise. Pt stated she accomplished her goal today. Pt stated she attend all groups held.    Felipa FurnaceChristopher  Elma Shands 05/13/2018, 9:41 PM

## 2018-05-13 NOTE — H&P (Signed)
Psychiatric Admission Assessment Adult  Patient Identification: Briana Sexton MRN:  [id number] Date of Evaluation:  05/13/2018 Chief Complaint:  " I feel like I could be going crazy" Principal Diagnosis:  MDD, no psychotic features, Homicidal Ideations Diagnosis:   Patient Active Problem List   Diagnosis Date Noted  . MDD (major depressive disorder) [F32.9] 05/12/2018  . Tobacco use [Z72.0] 10/09/2017  . Morbid obesity (HCC) [E66.01] 10/09/2017  . Calculus of gallbladder without cholecystitis without obstruction [K80.20]    History of Present Illness: 27 year old single female, presented to hospital voluntarily . Reports she has had violent, homicidal ideations towards her mother recently, with thoughts of stabbing her. States that these thoughts have been occurring x 2-3 weeks. States she is concerned about these thoughts and denies any prior  history of violence . She describes an element of obsessiveness recently, and describes above thoughts as obsessive, ego-dystonic, and without intent . States she has also been engaging in increased checking behaviors recently, as an example  checking and rechecking doors and stove to make sure it is off several times and taking a photo of stove prior to going to bed in order to confirm it is really off. She states that there has been some increased psychosocial tension and stress , feels that too much of the household responsibilities fall on her . States she feels her mother " is passive ". Patient states she has to make home decisions /deeal with home finances and assist mother, such as driving her to and from her appointments " even though I am so busy with my own life". She also endorses recent depression and endorses some neuro-vegetative symptoms of depression as below. States she has been feeling depressed intermittently , but has been feeling more depressed over recent days .  Associated Signs/Symptoms: Depression Symptoms:  depressed  mood, anhedonia, hypersomnia, suicidal thoughts without plan, anxiety, loss of energy/fatigue, decreased appetite, decreased sense of self esteem (Hypo) Manic Symptoms:  Does not endorse other than occasional subjective sense of racing thoughts  Anxiety Symptoms:  Increased anxiety  Psychotic Symptoms:  Denies ,  PTSD Symptoms: Denies  Total Time spent with patient: 45 minutes  Past Psychiatric History: no prior psychiatric admissions , no history of suicide attempts, history of one isolated episode of self cutting at age 27. No history of mania, no history of PTSD, reports recently worsening anxiety and panic attacks . Endorses some agoraphobia. Denies history of violence . States she had consulted as outpatient 2 years ago for depression, at which time she was prescribed Lexapro , but did not take it. She states she just recently started Lexapro 2-3 days ago.     Is the patient at risk to self? Yes.    Has the patient been a risk to self in the past 6 months? No.  Has the patient been a risk to self within the distant past? No.  Is the patient a risk to others? Yes.    Has the patient been a risk to others in the past 6 months? No.  Has the patient been a risk to others within the distant past? No.   Prior Inpatient Therapy: Prior Inpatient Therapy: No Prior Outpatient Therapy: Prior Outpatient Therapy: Yes Prior Therapy Dates: (active) Prior Therapy Facilty/Provider(s): ([facility]) Reason for Treatment: (depression and anxiety) Does patient have an ACCT team?: No Does patient have Intensive In-House Services?  : No Does patient have [facility] services? : No Does patient have P4CC services?: No  Alcohol  Screening: 1. How often do you have a drink containing alcohol?: 2 to 4 times a month 2. How many drinks containing alcohol do you have on a typical day when you are drinking?: 3 or 4 3. How often do you have six or more drinks on one occasion?: Never AUDIT-C Score:  3 4. How often during the last year have you found that you were not able to stop drinking once you had started?: Never 5. How often during the last year have you failed to do what was normally expected from you becasue of drinking?: Never 6. How often during the last year have you needed a first drink in the morning to get yourself going after a heavy drinking session?: Never 7. How often during the last year have you had a feeling of guilt of remorse after drinking?: Never 8. How often during the last year have you been unable to remember what happened the night before because you had been drinking?: Never 9. Have you or someone else been injured as a result of your drinking?: No 10. Has a relative or friend or a doctor or another health worker been concerned about your drinking or suggested you cut down?: No Alcohol Use Disorder Identification Test Final Score (AUDIT): 3 Intervention/Follow-up: AUDIT Score <7 follow-up not indicated Substance Abuse History in the last 12 months:  Denies alcohol or drug abuse  Consequences of Substance Abuse: Denies  Previous Psychotropic Medications:  Patient states she has been prescribed Lexapro, but had not been taking until 2-3 days ago- currently on 10 mgrs QDAY. Denies medication side effects thus far . No other psychiatric medication trials in the past . Psychological Evaluations:  No  Past Medical History:   Past Medical History:  Diagnosis Date  . Abnormal uterine bleeding   . Depression   . GERD (gastroesophageal reflux disease)   . Headache    MIGRAINES  . Pre-diabetes     Past Surgical History:  Procedure Laterality Date  . CHOLECYSTECTOMY N/A 01/30/2017   Procedure: LAPAROSCOPIC CHOLECYSTECTOMY;  Surgeon: Ancil Linsey, MD;  Location: [facility] ORS;  Service: General;  Laterality: N/A;  . NO PAST SURGERIES     Family History:  Parents alive, separated, patient has no relationship or contact with father , has two siblings  Family History   Problem Relation Age of Onset  . Hypertension Sister   . Diabetes Maternal Uncle   . Thyroid disease Mother    Family Psychiatric  History: states she has been told her father may have had bipolar disorder, sister has history of depression, no suicides in family  Tobacco Screening: smokes 5 cigarettes per day Social History: 27, single, no children, lives with sister, employed , currently in college  Social History   Substance and Sexual Activity  Alcohol Use Yes  . Alcohol/week: 0.0 standard drinks   Comment: OCC-ONCE WEEKLY     Social History   Substance and Sexual Activity  Drug Use Yes  . Types: Marijuana   Comment: VERY RARE    Additional Social History: Marital status: Single    Pain Medications: see MAR Prescriptions: see MAR Over the Counter: see MAR History of alcohol / drug use?: Yes Longest period of sobriety (when/how long): patient has no history of problematic use and states that she has not been drinking or using ion a reguler basis Name of Substance 1: alcohol  1 - Age of First Use: 14 1 - Amount (size/oz): 4 mixed drinks 1 - Frequency: 1  weekend a month 1 - Duration: since onset 1 - Last Use / Amount: last use was [place] Name of Substance 2: cannabis 2 - Age of First Use: unknown 2 - Amount (size/oz): undetermined amount 2 - Frequency: occasionally 2 - Duration: since onset(since onset) 2 - Last Use / Amount: undetermine amount  Allergies:   Allergies  Allergen Reactions  . Corn-Containing Products Nausea And Vomiting and Swelling  . Dairy Aid [Lactase] Nausea And Vomiting  . Peanut-Containing Drug Products Hives and Swelling   Lab Results:  Results for orders placed or performed during the hospital encounter of 05/12/18 (from the past 48 hour(s))  Urine rapid drug screen (hosp performed)not at [facility]     Status: None   Collection Time: 05/12/18  5:23 PM  Result Value Ref Range   Opiates NONE DETECTED NONE DETECTED   Cocaine NONE DETECTED NONE  DETECTED   Benzodiazepines NONE DETECTED NONE DETECTED   Amphetamines NONE DETECTED NONE DETECTED   Tetrahydrocannabinol NONE DETECTED NONE DETECTED   Barbiturates NONE DETECTED NONE DETECTED    Comment: (NOTE) DRUG SCREEN FOR MEDICAL PURPOSES ONLY.  IF CONFIRMATION IS NEEDED FOR ANY PURPOSE, NOTIFY LAB WITHIN 5 DAYS. LOWEST DETECTABLE LIMITS FOR URINE DRUG SCREEN Drug Class                     Cutoff (ng/mL) Amphetamine and metabolites    1000 Barbiturate and metabolites    200 Benzodiazepine                 200 Tricyclics and metabolites     300 Opiates and metabolites        300 Cocaine and metabolites        300 THC                            50 Performed at [facility], [address], [place], [place] [address]     Blood Alcohol level:  No results found for: [facility]  Metabolic Disorder Labs:  No results found for: HGBA1C, MPG No results found for: PROLACTIN No results found for: CHOL, TRIG, HDL, CHOLHDL, VLDL, LDLCALC  Current Medications: Current Facility-Administered Medications  Medication Dose Route Frequency Provider Last Rate Last Dose  . alum & mag hydroxide-simeth (MAALOX/MYLANTA) 200-200-20 MG/5ML suspension 30 mL  30 mL Oral Q4H PRN Oneta Rack, NP      . hydrOXYzine (ATARAX/VISTARIL) tablet 50 mg  50 mg Oral TID PRN Oneta Rack, NP   50 mg at 05/12/18 2210  . magnesium hydroxide (MILK OF MAGNESIA) suspension 30 mL  30 mL Oral Daily PRN Oneta Rack, NP       PTA Medications: Medications Prior to Admission  Medication Sig Dispense Refill Last Dose  . escitalopram (LEXAPRO) 10 MG tablet Take 10 mg by mouth daily.   unknown  . etonogestrel (NEXPLANON) 68 MG IMPL implant 1 each by Subdermal route once.   unknown  . cetirizine (ZYRTEC) 10 MG tablet Take 1 tablet (10 mg total) by mouth daily. (Patient not taking: Reported on 10/09/2017) 15 tablet 0 Not Taking at Unknown time  . ibuprofen (ADVIL,MOTRIN) 600 MG tablet Take 1 tablet by  mouth three times daily with meals (Patient not taking: Reported on 10/09/2017) 15 tablet 0 Not Taking at Unknown time  . metoCLOPramide (REGLAN) 10 MG tablet Take 1 tablet (10 mg total) by mouth every 8 (eight) hours as needed for  nausea. 20 tablet 0   . metroNIDAZOLE (FLAGYL) 500 MG tablet Take 1 tablet (500 mg total) by mouth 2 (two) times daily. (Patient not taking: Reported on 05/13/2018) 14 tablet 0 Completed Course at Unknown time  . nitrofurantoin, macrocrystal-monohydrate, (MACROBID) 100 MG capsule Take 1 capsule (100 mg total) by mouth 2 (two) times daily. (Patient not taking: Reported on 10/09/2017) 14 capsule 0 Completed Course at Unknown time  . norgestimate-ethinyl estradiol (ORTHO-CYCLEN,SPRINTEC,PREVIFEM) 0.25-35 MG-MCG tablet Take 1 tablet by mouth daily. (Patient not taking: Reported on 10/09/2017) 3 Package 4 Not Taking    Musculoskeletal: Strength & Muscle Tone: within normal limits Gait & Station: normal Patient leans: N/A  Psychiatric Specialty Exam: Physical Exam  Review of Systems  Constitutional: Negative.   HENT: Negative.   Eyes: Negative.   Respiratory: Negative.   Cardiovascular: Negative.   Gastrointestinal: Negative.   Genitourinary: Positive for frequency.  Musculoskeletal: Negative.   Skin: Negative.   Neurological: Negative.  Negative for seizures.  Endo/Heme/Allergies: Negative.   Psychiatric/Behavioral: Positive for depression and suicidal ideas. The patient is nervous/anxious.     Blood pressure 109/89, pulse 84, temperature 98.5 F (36.9 C), resp. rate 20, height 5\' 6"  (1.676 m), weight 117.9 kg.Body mass index is 41.97 kg/m.  General Appearance: Well Groomed  Eye Contact:  Good  Speech:  Normal Rate  Volume:  Normal  Mood:  reports some depression, describes mood as 3-4 /10  Affect:  constricted, but becomes more reactive during session  Thought Process:  Linear and Descriptions of Associations: Intact  Orientation:  Other:  oriented x 3, fully  alert and attentive  Thought Content:  no hallucinations, no delusions   Suicidal Thoughts:  No denies any current suicidal or self injurious ideations, and contracts for safety on unit   Homicidal Thoughts:  Yes.  without intent/plan- reports intermittent HI towards her mother  Memory:  recent and remote grossly intact   Judgement:  Fair  Insight:  Fair  Psychomotor Activity:  Normal  Concentration:  Concentration: Good and Attention Span: Good  Recall:  Good  Fund of Knowledge:  Good  Language:  Good  Akathisia:  No  Handed:  Right  AIMS (if indicated):     Assets:  Communication Skills Desire for Improvement Resilience  ADL's:  Intact  Cognition:  WNL  Sleep:  Number of Hours: 6.75    Treatment Plan Summary: Daily contact with patient to assess and evaluate symptoms and progress in treatment, Medication management, Plan inpatient treatment and medications as below  Observation Level/Precautions:  15 minute checks  Laboratory:  as needed- UA, UDS, UPreg, TSH, BMP, CBC, TSH, HgbA1C, Lipid Panel  Psychotherapy:  [facility], group   Medications: States that she had stopped Lexapro in the past because " I felt better", and denies side effects thus far- restarted it 2-3 days ago.  Continue LEXAPRO 10 mgrs QDAY initially . Add ABILIFY 2 mgrs QDAY for augmentation. Side effects discussed    Consultations:  As needed   Discharge Concerns: -   Estimated LOS: 5 days   Other:     Physician Treatment Plan for Primary Diagnosis: MDD, no psychotic features Long Term Goal(s): Improvement in symptoms so as ready for discharge  Short Term Goals: Ability to identify changes in lifestyle to reduce recurrence of condition will improve and Ability to maintain clinical measurements within normal limits will improve  Physician Treatment Plan for Secondary Diagnosis: Homicidal Ideations Long Term Goal(s): Improvement in symptoms so as ready for discharge  Short Term Goals: Ability to identify  changes in lifestyle to reduce recurrence of condition will improve, Ability to verbalize feelings will improve, Ability to disclose and discuss suicidal ideas, Ability to demonstrate self-control will improve and Ability to identify and develop effective coping behaviors will improve  I certify that inpatient services furnished can reasonably be expected to improve the patient's condition.    Craige Cotta, MD 9/17/20197:56 AM

## 2018-05-13 NOTE — [facility] Group Notes (Signed)
Adult Psychoeducational Group Note  Date:  05/13/2018 Time:  4:00 pm  Group Topic/Focus: Therapeutic Relaxation Self Care:   The focus of this group is to help patients understand the importance of self-care in order to improve or restore emotional, physical, spiritual, interpersonal, and financial health.  Participation Level:  Active  Participation Quality:  Appropriate  Affect:  Appropriate  Cognitive:  Alert and Oriented  Insight: Improving  Engagement in Group:  Developing/Improving  Modes of Intervention:  Activity, Discussion, Education, Rapport Building and Socialization  Additional Comments:  Patient reports she likes to talk to someone or write her feelings to help decrease stress.  Briana Sexton 05/13/2018, 5:00 PM

## 2018-05-13 NOTE — [facility] Suicide Risk Assessment (Signed)
[facility] INPATIENT:  Family/Significant Other Suicide Prevention Education  Suicide Prevention Education:  Education Completed; Briana Sexton, friend [phone]) has been identified by the patient as the family member/significant other with whom the patient will be residing, and identified as the person(s) who will aid the patient in the event of a mental health crisis (suicidal ideations/suicide attempt).  With written consent from the patient, the family member/significant other has been provided the following suicide prevention education, prior to the and/or following the discharge of the patient.  The suicide prevention education provided includes the following:  Suicide risk factors  Suicide prevention and interventions  National Suicide Hotline telephone number  [facility] assessment telephone number  [facility] Assistance 911  [facility] and/or Residential Mobile Crisis Unit telephone number  Request made of family/significant other to:  Remove weapons (e.g., guns, rifles, knives), all items previously/currently identified as safety concern.    Remove drugs/medications (over-the-counter, prescriptions, illicit drugs), all items previously/currently identified as a safety concern.  The family member/significant other verbalizes understanding of the suicide prevention education information provided.  The family member/significant other agrees to remove the items of safety concern listed above.  Briana Sexton 05/13/2018, 1:01 PM

## 2018-05-13 NOTE — Plan of Care (Signed)
  Problem: Education: Goal: Knowledge of [facility] Education information/materials will improve Outcome: Progressing Goal: Emotional status will improve Outcome: Progressing Goal: Mental status will improve Outcome: Progressing Goal: Verbalization of understanding the information provided will improve Outcome: Progressing   Problem: Activity: Goal: Interest or engagement in activities will improve Outcome: Progressing Goal: Sleeping patterns will improve Outcome: Progressing   Problem: Coping: Goal: Ability to verbalize frustrations and anger appropriately will improve Outcome: Progressing Goal: Ability to demonstrate self-control will improve Outcome: Progressing   Problem: Health Behavior/Discharge Planning: Goal: Identification of resources available to assist in meeting health care needs will improve Outcome: Progressing Goal: Compliance with treatment plan for underlying cause of condition will improve Outcome: Progressing   Problem: Physical Regulation: Goal: Ability to maintain clinical measurements within normal limits will improve Outcome: Progressing   Problem: Safety: Goal: Periods of time without injury will increase Outcome: Progressing   Problem: Education: Goal: Ability to make informed decisions regarding treatment will improve Outcome: Progressing   Problem: Coping: Goal: Coping ability will improve Outcome: Progressing   Problem: Health Behavior/Discharge Planning: Goal: Identification of resources available to assist in meeting health care needs will improve Outcome: Progressing   Problem: Medication: Goal: Compliance with prescribed medication regimen will improve Outcome: Progressing   Problem: Self-Concept: Goal: Ability to disclose and discuss suicidal ideas will improve Outcome: Progressing Goal: Will verbalize positive feelings about self Outcome: Progressing   Problem: Education: Goal: Utilization of techniques to improve  thought processes will improve Outcome: Progressing Goal: Knowledge of the prescribed therapeutic regimen will improve Outcome: Progressing   Problem: Activity: Goal: Interest or engagement in leisure activities will improve Outcome: Progressing Goal: Imbalance in normal sleep/wake cycle will improve Outcome: Progressing   Problem: Coping: Goal: Coping ability will improve Outcome: Progressing Goal: Will verbalize feelings Outcome: Progressing   Problem: Health Behavior/Discharge Planning: Goal: Ability to make decisions will improve Outcome: Progressing Goal: Compliance with therapeutic regimen will improve Outcome: Progressing   Problem: Role Relationship: Goal: Will demonstrate positive changes in social behaviors and relationships Outcome: Progressing   Problem: Safety: Goal: Ability to disclose and discuss suicidal ideas will improve Outcome: Progressing Goal: Ability to identify and utilize support systems that promote safety will improve Outcome: Progressing   Problem: Self-Concept: Goal: Will verbalize positive feelings about self Outcome: Progressing Goal: Level of anxiety will decrease Outcome: Progressing   Problem: Education: Goal: Knowledge of General Education information will improve Description Including pain rating scale, medication(s)/side effects and non-pharmacologic comfort measures Outcome: Progressing   Problem: Health Behavior/Discharge Planning: Goal: Ability to manage health-related needs will improve Outcome: Progressing   Problem: Coping: Goal: Level of anxiety will decrease Outcome: Progressing   Problem: Safety: Goal: Ability to remain free from injury will improve Outcome: Progressing   

## 2018-05-14 LAB — TSH: TSH: 1.069 u[IU]/mL (ref 0.350–4.500)

## 2018-05-14 NOTE — Progress Notes (Addendum)
[facility] MD Progress Note  05/14/2018 12:19 PM Briana Sexton  MRN:  [id number] Subjective: Patient reports partial improvement compared to how she felt prior to admission. Describes decreased anxiety and decreased violent/homicidal ideations.  States "my mother came and visited me yesterday, and I was able to think that I love her, and I would never hurt her".  States she was able to confront her mother regarding her concerns that she has been passive, leading to patient having to deal with increased household/home responsibilities  Currently denies medication side effects. Denies suicidal ideations.  Objective: I have discussed case with treatment team and have met with patient 27 year old single female, presented to hospital voluntarily reporting homicidal ideations towards mother which she describes as ego-dystonic, obsessive, without any actual intention.  As above, reports that these ideations have decreased and that a visit from mother yesterday evening went well without any recurrence of said ideations.  Currently denies any plan or intention of hurting her mother or anybody else. Reports improving mood, describes lingering depression/anxiety.  Denies psychotic symptoms and does not appear internally preoccupied. Currently on Lexapro which she is tolerating well thus far. No disruptive or agitated behaviors on unit. Denies suicidal ideations Patient reported "hearing voices" earlier.  I have explored this with patient.  She states that these are her own thoughts , and currently does not endorse actual hallucinations nor does she appear internally preoccupied.  No delusions expressed Labs reviewed- TSH WNL    Principal Problem: MDD Diagnosis:   Patient Active Problem List   Diagnosis Date Noted  . MDD (major depressive disorder) [F32.9] 05/12/2018  . Tobacco use [Z72.0] 10/09/2017  . Morbid obesity ([place]) [E66.01] 10/09/2017  . Calculus of gallbladder without cholecystitis without  obstruction [K80.20]    Total Time spent with patient: 20 minutes  Past Psychiatric History:   Past Medical History:  Past Medical History:  Diagnosis Date  . Abnormal uterine bleeding   . Depression   . GERD (gastroesophageal reflux disease)   . Headache    MIGRAINES  . Pre-diabetes     Past Surgical History:  Procedure Laterality Date  . CHOLECYSTECTOMY N/A 01/30/2017   Procedure: LAPAROSCOPIC CHOLECYSTECTOMY;  Surgeon: Vickie Epley, MD;  Location: [facility] ORS;  Service: General;  Laterality: N/A;  . NO PAST SURGERIES     Family History:  Family History  Problem Relation Age of Onset  . Hypertension Sister   . Diabetes Maternal Uncle   . Thyroid disease Mother    Family Psychiatric  History:  Social History:  Social History   Substance and Sexual Activity  Alcohol Use Yes  . Alcohol/week: 0.0 standard drinks   Comment: OCC-ONCE WEEKLY     Social History   Substance and Sexual Activity  Drug Use Yes  . Types: Marijuana   Comment: VERY RARE    Social History   Socioeconomic History  . Marital status: Single    Spouse name: Not on file  . Number of children: Not on file  . Years of education: Not on file  . Highest education level: Not on file  Occupational History  . Not on file  Social Needs  . Financial resource strain: Not on file  . Food insecurity:    Worry: Not on file    Inability: Not on file  . Transportation needs:    Medical: Not on file    Non-medical: Not on file  Tobacco Use  . Smoking status: Current Every Day Smoker    Packs/day:  0.50    Years: 3.00    Pack years: 1.50    Types: Cigarettes  . Smokeless tobacco: Never Used  Substance and Sexual Activity  . Alcohol use: Yes    Alcohol/week: 0.0 standard drinks    Comment: OCC-ONCE WEEKLY  . Drug use: Yes    Types: Marijuana    Comment: VERY RARE  . Sexual activity: Not Currently  Lifestyle  . Physical activity:    Days per week: Not on file    Minutes per session: Not on  file  . Stress: Not on file  Relationships  . Social connections:    Talks on phone: Not on file    Gets together: Not on file    Attends religious service: Not on file    Active member of club or organization: Not on file    Attends meetings of clubs or organizations: Not on file    Relationship status: Not on file  Other Topics Concern  . Not on file  Social History Narrative  . Not on file   Additional Social History:    Pain Medications: see MAR Prescriptions: see MAR Over the Counter: see MAR History of alcohol / drug use?: Yes Longest period of sobriety (when/how long): patient has no history of problematic use and states that she has not been drinking or using ion a reguler basis Name of Substance 1: alcohol  1 - Age of First Use: 14 1 - Amount (size/oz): 4 mixed drinks 1 - Frequency: 1 weekend a month 1 - Duration: since onset 1 - Last Use / Amount: last use was [place] Name of Substance 2: cannabis 2 - Age of First Use: unknown 2 - Amount (size/oz): undetermined amount 2 - Frequency: occasionally 2 - Duration: since onset(since onset) 2 - Last Use / Amount: undetermine amount   Sleep: Improving  Appetite:  Improving  Current Medications: Current Facility-Administered Medications  Medication Dose Route Frequency Provider Last Rate Last Dose  . alum & mag hydroxide-simeth (MAALOX/MYLANTA) 200-200-20 MG/5ML suspension 30 mL  30 mL Oral Q4H PRN Derrill Center, NP      . escitalopram (LEXAPRO) tablet 10 mg  10 mg Oral Daily Cobos, Myer Peer, MD   10 mg at 05/14/18 0751  . hydrOXYzine (ATARAX/VISTARIL) tablet 50 mg  50 mg Oral TID PRN Derrill Center, NP   50 mg at 05/13/18 2127  . LORazepam (ATIVAN) tablet 0.5 mg  0.5 mg Oral Q6H PRN Cobos, Myer Peer, MD   0.5 mg at 05/13/18 1100  . magnesium hydroxide (MILK OF MAGNESIA) suspension 30 mL  30 mL Oral Daily PRN Derrill Center, NP        Lab Results:  Results for orders placed or performed during the hospital  encounter of 05/12/18 (from the past 48 hour(s))  Urine rapid drug screen (hosp performed)not at [facility]     Status: None   Collection Time: 05/12/18  5:23 PM  Result Value Ref Range   Opiates NONE DETECTED NONE DETECTED   Cocaine NONE DETECTED NONE DETECTED   Benzodiazepines NONE DETECTED NONE DETECTED   Amphetamines NONE DETECTED NONE DETECTED   Tetrahydrocannabinol NONE DETECTED NONE DETECTED   Barbiturates NONE DETECTED NONE DETECTED    Comment: (NOTE) DRUG SCREEN FOR MEDICAL PURPOSES ONLY.  IF CONFIRMATION IS NEEDED FOR ANY PURPOSE, NOTIFY LAB WITHIN 5 DAYS. LOWEST DETECTABLE LIMITS FOR URINE DRUG SCREEN Drug Class  Cutoff (ng/mL) Amphetamine and metabolites    1000 Barbiturate and metabolites    200 Benzodiazepine                 573 Tricyclics and metabolites     300 Opiates and metabolites        300 Cocaine and metabolites        300 THC                            50 Performed at [facility], [place] [address], [place], [place] [address]   TSH     Status: None   Collection Time: 05/13/18  6:44 AM  Result Value Ref Range   TSH 1.007 0.350 - 4.500 uIU/mL    Comment: Performed by a 3rd Generation assay with a functional sensitivity of <=0.01 uIU/mL. Performed at [facility], [place] [address], [place], [place] [address]   Urinalysis, Complete w Microscopic     Status: Abnormal   Collection Time: 05/13/18  6:18 PM  Result Value Ref Range   Color, Urine YELLOW YELLOW   APPearance CLEAR CLEAR   Specific Gravity, Urine 1.015 1.005 - 1.030   pH 6.0 5.0 - 8.0   Glucose, UA NEGATIVE NEGATIVE mg/dL   Hgb urine dipstick LARGE (A) NEGATIVE   Bilirubin Urine NEGATIVE NEGATIVE   Ketones, ur NEGATIVE NEGATIVE mg/dL   Protein, ur NEGATIVE NEGATIVE mg/dL   Nitrite NEGATIVE NEGATIVE   Leukocytes, UA TRACE (A) NEGATIVE   RBC / HPF 11-20 0 - 5 RBC/hpf   WBC, UA 6-10 0 - 5 WBC/hpf   Bacteria, UA RARE (A) NONE SEEN   Squamous  Epithelial / LPF 0-5 0 - 5   Mucus PRESENT     Comment: Performed at [facility], [place] [address] [place], [place] [address]  Rapid urine drug screen (hospital performed)     Status: None   Collection Time: 05/13/18  6:18 PM  Result Value Ref Range   Opiates NONE DETECTED NONE DETECTED   Cocaine NONE DETECTED NONE DETECTED   Benzodiazepines NONE DETECTED NONE DETECTED   Amphetamines NONE DETECTED NONE DETECTED   Tetrahydrocannabinol NONE DETECTED NONE DETECTED   Barbiturates NONE DETECTED NONE DETECTED    Comment: (NOTE) DRUG SCREEN FOR MEDICAL PURPOSES ONLY.  IF CONFIRMATION IS NEEDED FOR ANY PURPOSE, NOTIFY LAB WITHIN 5 DAYS. LOWEST DETECTABLE LIMITS FOR URINE DRUG SCREEN Drug Class                     Cutoff (ng/mL) Amphetamine and metabolites    1000 Barbiturate and metabolites    200 Benzodiazepine                 831 Tricyclics and metabolites     300 Opiates and metabolites        300 Cocaine and metabolites        300 THC                            50 Performed at [facility], [place] [address] [place], [place] [address]   Pregnancy, urine     Status: None   Collection Time: 05/13/18  6:18 PM  Result Value Ref Range   Preg Test, Ur NEGATIVE NEGATIVE    Comment:        THE SENSITIVITY OF THIS METHODOLOGY IS >20  mIU/mL. Performed at [facility], [place] [address] [place], [place] [address]   Basic metabolic panel     Status: Abnormal   Collection Time: 05/13/18  6:22 PM  Result Value Ref Range   Sodium 138 135 - 145 mmol/L   Potassium 4.0 3.5 - 5.1 mmol/L   Chloride 104 98 - 111 mmol/L   CO2 26 22 - 32 mmol/L   Glucose, Bld 121 (H) 70 - 99 mg/dL   BUN 10 6 - 20 mg/dL   Creatinine, Ser 0.83 0.44 - 1.00 mg/dL   Calcium 9.0 8.9 - 10.3 mg/dL   GFR calc non Af Amer >60 >60 mL/min   GFR calc Af Amer >60 >60 mL/min    Comment: (NOTE) The eGFR has been calculated using the CKD EPI equation. This calculation  has not been validated in all clinical situations. eGFR's persistently <60 mL/min signify possible Chronic Kidney Disease.    Anion gap 8 5 - 15    Comment: Performed at [facility], [place] [address], [place], [place] [address]  CBC with Differential/Platelet     Status: None   Collection Time: 05/13/18  6:22 PM  Result Value Ref Range   WBC 10.4 4.0 - 10.5 K/uL   RBC 4.30 3.87 - 5.11 MIL/uL   Hemoglobin 12.8 12.0 - 15.0 g/dL   HCT 37.9 36.0 - 46.0 %   MCV 88.1 78.0 - 100.0 fL   MCH 29.8 26.0 - 34.0 pg   MCHC 33.8 30.0 - 36.0 g/dL   RDW 12.9 11.5 - 15.5 %   Platelets 373 150 - 400 K/uL   Neutrophils Relative % 69 %   Neutro Abs 7.1 1.7 - 7.7 K/uL   Lymphocytes Relative 25 %   Lymphs Abs 2.6 0.7 - 4.0 K/uL   Monocytes Relative 5 %   Monocytes Absolute 0.5 0.1 - 1.0 K/uL   Eosinophils Relative 1 %   Eosinophils Absolute 0.1 0.0 - 0.7 K/uL   Basophils Relative 0 %   Basophils Absolute 0.0 0.0 - 0.1 K/uL    Comment: Performed at [facility], [place] [address] [place], [place] [address]  Lipid panel     Status: Abnormal   Collection Time: 05/13/18  6:22 PM  Result Value Ref Range   Cholesterol 147 0 - 200 mg/dL   Triglycerides 135 <150 mg/dL   HDL 33 (L) >40 mg/dL   Total CHOL/HDL Ratio 4.5 RATIO   VLDL 27 0 - 40 mg/dL   LDL Cholesterol 87 0 - 99 mg/dL    Comment:        Total Cholesterol/HDL:CHD Risk Coronary Heart Disease Risk Table                     Men   Women  1/2 Average Risk   3.4   3.3  Average Risk       5.0   4.4  2 X Average Risk   9.6   7.1  3 X Average Risk  23.4   11.0        Use the calculated Patient Ratio above and the CHD Risk Table to determine the patient's CHD Risk.        ATP III CLASSIFICATION (LDL):  <100     mg/dL   Optimal  100-129  mg/dL   Near or Above  Optimal  130-159  mg/dL   Borderline  160-189  mg/dL   High  >190     mg/dL   Very High Performed at [place] [address] [place], [place] [address]   TSH     Status: None   Collection Time: 05/14/18  6:29 AM  Result Value Ref Range   TSH 1.069 0.350 - 4.500 uIU/mL    Comment: Performed by a 3rd Generation assay with a functional sensitivity of <=0.01 uIU/mL. Performed at [facility], [place] [address], [place], [place] [address]     Blood Alcohol level:  No results found for: [facility]  Metabolic Disorder Labs: No results found for: HGBA1C, MPG No results found for: PROLACTIN Lab Results  Component Value Date   CHOL 147 05/13/2018   TRIG 135 05/13/2018   HDL 33 (L) 05/13/2018   CHOLHDL 4.5 05/13/2018   VLDL 27 05/13/2018   LDLCALC 87 05/13/2018    Physical Findings: AIMS: Facial and Oral Movements Muscles of Facial Expression: None, normal Lips and Perioral Area: None, normal Jaw: None, normal Tongue: None, normal,Extremity Movements Upper (arms, wrists, hands, fingers): None, normal Lower (legs, knees, ankles, toes): None, normal, Trunk Movements Neck, shoulders, hips: None, normal, Overall Severity Severity of abnormal movements (highest score from questions above): None, normal Incapacitation due to abnormal movements: None, normal Patient's awareness of abnormal movements (rate only patient's report): No Awareness, Dental Status Current problems with teeth and/or dentures?: No Does patient usually wear dentures?: No  CIWA:    COWS:     Musculoskeletal: Strength & Muscle Tone: within normal limits Gait & Station: normal Patient leans: N/A  Psychiatric Specialty Exam: Physical Exam  ROS denies chest pain, no shortness of breath, no nausea, no vomiting  Blood pressure 116/80, pulse 78, temperature 98 F (36.7 C), temperature source Oral, resp. rate 20, height 5' 6" (1.676 m), weight 117.9 kg.Body mass index is 41.97 kg/m.  General Appearance: Well Groomed  Eye Contact:  Good  Speech:  Normal Rate  Volume:  Normal  Mood:  Currently reports  improving mood  Affect:  Vaguely anxious/more reactive, improves during session  Thought Process:  Linear and Descriptions of Associations: Intact  Orientation:  Full (Time, Place, and Person)  Thought Content:  Currently denies hallucinations and does not appear internally preoccupied( see above)  Suicidal Thoughts:  No-currently denies suicidal or self-injurious ideations and contracts for safety on unit, as above currently also denies any homicidal or violent plan or intention and specifically denies any plan or intention of hurting her mother. Contracts for safety on unit  Homicidal Thoughts:  No  Memory:  Recent and remote grossly intact  Judgement:  Other:  Improving  Insight:  Fair and Improving  Psychomotor Activity:  Normal  Concentration:  Concentration: Good and Attention Span: Good  Recall:  Good  Fund of Knowledge:  Good  Language:  Good  Akathisia:  Negative  Handed:  Right  AIMS (if indicated):     Assets:  Desire for Improvement Resilience  ADL's:  Intact  Cognition:  WNL  Sleep:  Number of Hours: 6   Assessment -27 year old female, presented to the hospital voluntarily reporting depression, neurovegetative symptoms of depression, homicidal ideations towards her mother which she describes as excessive, ego-dystonic, and without any actual intent.  States that visit from mother yesterday evening went well without any increased homicidal ideations or ruminations and currently denies any HI or violent plan or intention towards mother.  Today more focused on her sister whom she states has not been supportive.  Denies medication side effects and is currently tolerating Lexapro trial well.  At this time denies auditory hallucinations and does not appear internally preoccupied.  We discussed adding Abilify for augmentation and to address ruminations further but prefers to continue monotherapy with Lexapro at this time  Treatment Plan Summary: Daily contact with patient to assess  and evaluate symptoms and progress in treatment, Medication management, Plan Inpatient treatment and Medications as below Encourage group and milieu participation to work on coping skills and symptom reduction Continue Lexapro 10 mg daily for depression and anxiety Continue Ativan 0.5 mg every 6 hours PRN for anxiety Treatment team working on disposition planning. Jenne Campus, MD 05/14/2018, 12:19 PM

## 2018-05-14 NOTE — Progress Notes (Signed)
Recreation Therapy Notes  Date: 9.18.19 Time: 0930 Location: [address] Dayroom  Group Topic: Stress Management  Goal Area(s) Addresses:  Patient will verbalize importance of using healthy stress management.  Patient will identify positive emotions associated with healthy stress management.   Intervention: Stress Management  Activity :  Meditation.  LRT introduced the stress management technique of meditation to patients.  LRT played a meditation on choice for patients.  Patients were to follow along as meditation played to engage in the activity.  Education:  Stress Management, Discharge Planning.   Education Outcome: Acknowledges edcuation/In group clarification offered/Needs additional education  Clinical Observations/Feedback: Pt did not attend group.    Briana Sexton, LRT/CTRS         Loni Abdon A 05/14/2018 10:55 AM 

## 2018-05-14 NOTE — Plan of Care (Signed)
Nurse discussed anxiety, depression, coping skills with patient. 

## 2018-05-14 NOTE — Tx Team (Signed)
Interdisciplinary Treatment and Diagnostic Plan Update  05/14/2018 Time of Session: 9:30am Briana Sexton MRN: [id number]  Principal Diagnosis: <principal problem not specified>  Secondary Diagnoses: Active Problems:   MDD (major depressive disorder)   Current Medications:  Current Facility-Administered Medications  Medication Dose Route Frequency Provider Last Rate Last Dose  . alum & mag hydroxide-simeth (MAALOX/MYLANTA) 200-200-20 MG/5ML suspension 30 mL  30 mL Oral Q4H PRN Derrill Center, NP      . escitalopram (LEXAPRO) tablet 10 mg  10 mg Oral Daily Cobos, Myer Peer, MD   10 mg at 05/14/18 0751  . hydrOXYzine (ATARAX/VISTARIL) tablet 50 mg  50 mg Oral TID PRN Derrill Center, NP   50 mg at 05/13/18 2127  . LORazepam (ATIVAN) tablet 0.5 mg  0.5 mg Oral Q6H PRN Cobos, Myer Peer, MD   0.5 mg at 05/13/18 1100  . magnesium hydroxide (MILK OF MAGNESIA) suspension 30 mL  30 mL Oral Daily PRN Derrill Center, NP       PTA Medications: Medications Prior to Admission  Medication Sig Dispense Refill Last Dose  . escitalopram (LEXAPRO) 10 MG tablet Take 10 mg by mouth daily.   unknown  . etonogestrel (NEXPLANON) 68 MG IMPL implant 1 each by Subdermal route once.   unknown  . cetirizine (ZYRTEC) 10 MG tablet Take 1 tablet (10 mg total) by mouth daily. (Patient not taking: Reported on 10/09/2017) 15 tablet 0 Not Taking at Unknown time  . ibuprofen (ADVIL,MOTRIN) 600 MG tablet Take 1 tablet by mouth three times daily with meals (Patient not taking: Reported on 10/09/2017) 15 tablet 0 Not Taking at Unknown time  . metoCLOPramide (REGLAN) 10 MG tablet Take 1 tablet (10 mg total) by mouth every 8 (eight) hours as needed for nausea. 20 tablet 0   . metroNIDAZOLE (FLAGYL) 500 MG tablet Take 1 tablet (500 mg total) by mouth 2 (two) times daily. (Patient not taking: Reported on 05/13/2018) 14 tablet 0 Completed Course at Unknown time  . nitrofurantoin, macrocrystal-monohydrate, (MACROBID) 100 MG capsule  Take 1 capsule (100 mg total) by mouth 2 (two) times daily. (Patient not taking: Reported on 10/09/2017) 14 capsule 0 Completed Course at Unknown time  . norgestimate-ethinyl estradiol (ORTHO-CYCLEN,SPRINTEC,PREVIFEM) 0.25-35 MG-MCG tablet Take 1 tablet by mouth daily. (Patient not taking: Reported on 10/09/2017) 3 Package 4 Not Taking    Patient Stressors: Marital or family conflict Medication change or noncompliance Occupational concerns  Patient Strengths: Ability for insight Average or above average intelligence Capable of independent living General fund of knowledge Supportive family/friends  Treatment Modalities: Medication Management, Group therapy, Case management,  1 to 1 session with clinician, Psychoeducation, Recreational therapy.   Physician Treatment Plan for Primary Diagnosis: <principal problem not specified> Long Term Goal(s): Improvement in symptoms so as ready for discharge Improvement in symptoms so as ready for discharge   Short Term Goals: Ability to identify changes in lifestyle to reduce recurrence of condition will improve Ability to maintain clinical measurements within normal limits will improve Ability to identify changes in lifestyle to reduce recurrence of condition will improve Ability to verbalize feelings will improve Ability to disclose and discuss suicidal ideas Ability to demonstrate self-control will improve Ability to identify and develop effective coping behaviors will improve  Medication Management: Evaluate patient's response, side effects, and tolerance of medication regimen.  Therapeutic Interventions: 1 to 1 sessions, Unit Group sessions and Medication administration.  Evaluation of Outcomes: Not Met  Physician Treatment Plan for Secondary Diagnosis: Active Problems:  MDD (major depressive disorder)  Long Term Goal(s): Improvement in symptoms so as ready for discharge Improvement in symptoms so as ready for discharge   Short Term  Goals: Ability to identify changes in lifestyle to reduce recurrence of condition will improve Ability to maintain clinical measurements within normal limits will improve Ability to identify changes in lifestyle to reduce recurrence of condition will improve Ability to verbalize feelings will improve Ability to disclose and discuss suicidal ideas Ability to demonstrate self-control will improve Ability to identify and develop effective coping behaviors will improve     Medication Management: Evaluate patient's response, side effects, and tolerance of medication regimen.  Therapeutic Interventions: 1 to 1 sessions, Unit Group sessions and Medication administration.  Evaluation of Outcomes: Not Met   RN Treatment Plan for Primary Diagnosis: <principal problem not specified> Long Term Goal(s): Knowledge of disease and therapeutic regimen to maintain health will improve  Short Term Goals: Ability to verbalize feelings will improve, Ability to disclose and discuss suicidal ideas, Ability to identify and develop effective coping behaviors will improve and Compliance with prescribed medications will improve  Medication Management: RN will administer medications as ordered by provider, will assess and evaluate patient's response and provide education to patient for prescribed medication. RN will report any adverse and/or side effects to prescribing provider.  Therapeutic Interventions: 1 on 1 counseling sessions, Psychoeducation, Medication administration, Evaluate responses to treatment, Monitor vital signs and CBGs as ordered, Perform/monitor CIWA, COWS, AIMS and Fall Risk screenings as ordered, Perform wound care treatments as ordered.  Evaluation of Outcomes: Not Met   LCSW Treatment Plan for Primary Diagnosis: <principal problem not specified> Long Term Goal(s): Safe transition to appropriate next level of care at discharge, Engage patient in therapeutic group addressing interpersonal  concerns.  Short Term Goals: Engage patient in aftercare planning with referrals and resources  Therapeutic Interventions: Assess for all discharge needs, 1 to 1 time with Social worker, Explore available resources and support systems, Assess for adequacy in community support network, Educate family and significant other(s) on suicide prevention, Complete Psychosocial Assessment, Interpersonal group therapy.  Evaluation of Outcomes: Not Met   Progress in Treatment: Attending groups: Yes. Participating in groups: Yes. Taking medication as prescribed: Yes. Toleration medication: Yes. Family/Significant other contact made: Yes, individual(s) contacted:  the patient's friend Patient understands diagnosis: Yes. Discussing patient identified problems/goals with staff: Yes. Medical problems stabilized or resolved: Yes. Denies suicidal/homicidal ideation: Yes. Issues/concerns per patient self-inventory: No. Other:  New problem(s) identified: None   New Short Term/Long Term Goal(s):medication stabilization, elimination of SI thoughts, development of comprehensive mental wellness plan.   Patient Goals:  Help with work and family issues   Discharge Plan or Barriers: Patient plans to discharge home and follow up with [facility] for outpatient medication management and therapy services.   Reason for Continuation of Hospitalization: Depression Medication stabilization Suicidal ideation   Estimated Length of Stay: 3-5 days   Attendees: Patient: 05/14/2018 1:31 PM  Physician: Dr. Neita Garnet, MD 05/14/2018 1:31 PM  Nursing: Rise Paganini.Raliegh Ip, RN 05/14/2018 1:31 PM  RN Care Manager: Rhunette Croft 05/14/2018 1:31 PM  Social Worker: Theresa Duty 05/14/2018 1:31 PM  Recreational Therapist: X 05/14/2018 1:31 PM  Other: X 05/14/2018 1:31 PM  Other: X 05/14/2018 1:31 PM  Other:X 05/14/2018 1:31 PM    Scribe for Treatment Team: Marylee Floras, [place] 05/14/2018 1:31 PM

## 2018-05-14 NOTE — [facility] Group Notes (Signed)
[facility] Group Notes:  (Nursing/Personal Developement)  Date:  05/14/2018  Time:  1330 Type of Therapy:  Nurse Education  Participation Level:  Engaged  Participation Quality:  Attentive, Sharing and Supportive  Affect:  Appropriate  Cognitive:  Alert and Oriented  Insight:  Good and Improving  Engagement in Group:  Engaged and Supportive  Modes of Intervention:  Discussion, Education and Support  Summary of Progress/Problems: Pt attended group and participated in discussion.   Sherryl MangesWesseh, Katoya Amato 05/14/2018, 7:31 PM

## 2018-05-14 NOTE — Progress Notes (Signed)
  D: Pt denies SI/HI/AVH. Pt is pleasant and cooperative. Pt stated she was doing ok, pt issue was Anxiety. Pt educated on her doses of Vistaril she has access to during the day. Pt visible in the dayroom .  A: Pt was offered support and encouragement. Pt was given scheduled medications. Pt was encourage to attend groups. Q 15 minute checks were done for safety.  R:Pt attends groups and interacts well with peers and staff. Pt is taking medication. Pt has no complaints.Pt receptive to treatment and safety maintained on unit.  Problem: Education: Goal: Mental status will improve Outcome: Progressing   Problem: Activity: Goal: Sleeping patterns will improve Outcome: Progressing   Problem: Coping: Goal: Ability to demonstrate self-control will improve Outcome: Progressing

## 2018-05-14 NOTE — [facility] Group Notes (Signed)
[facility] [employer] Group Therapy      05/14/2018 2:47 PM  Type of Therapy: [employer] Presentation  Participation Level: Active  Participation Quality: Attentive  Affect: Appropriate  Cognitive: Oriented  Insight: Developing/Improving  Engagement in Therapy: Engaged  Modes of Intervention: Discussion, Education and Socialization  Summary of Progress/Problems: [employer] ([employer]) Speaker came to talk about his personal journey with mental health. The pt processed ways by which to relate to the speaker. MHA speaker provided handouts and educational information pertaining to groups and services offered by the M[employer]. Pt was engaged in speaker's presentation and was receptive to resources provided.    Cataleia Gade LCSWA Clinical Social Worker   

## 2018-05-14 NOTE — Progress Notes (Signed)
D:  Patient's self inventory sheet, patient has fair sleep, sleep medication helpful.  Good appetite, normal energy level, good concentration.  Rated depression 4, hopeless 3, anxiety 6.  Denied withdrawals.  Denied SI.  Physical problems, worst pain in past 24 hours is #7.  Goal is "figure out if I want my sister to come back today.  Assess my feelings in depth.  Think about it.  Make right decisions for me."  No discharge plans. A:  Medications administered per MD orders.  Emotional support and encouragement given patient. R:  Denied SI and HI, contracts for safety.  Denied A/V hallucinations.  Safety maintained with 15 minute checks.

## 2018-05-14 NOTE — Progress Notes (Signed)
Nursing note 7p-7a  Pt observed interacting with peers on unit this shift. Displayed a flat affect and mood upon interaction with this Clinical research associatewriter. Pt denies pain ,denies SI/HI, and also denies any audio or visual hallucinations at this time. Pt stated " I heard voices earlier that told me to hurt people, but not right now. I just want to make sure I am not crazy. That's why I need to talk to the doctor tomorrow." Pt complained of anxiety. PRN vistaril administered per MD order. See Mar for prn medication administration record.  Pt is able to verbally contract for safety with this RN. Goal: "to look at things from a different perspective" Pt is now resting in bed with eyes closed, with no signs or symptoms of pain or distress noted. Pt continues to remain safe on the unit and is observed by rounding every 15 min. RN will continue to monitor.

## 2018-05-15 ENCOUNTER — Ambulatory Visit: Payer: 59 | Admitting: Psychology

## 2018-05-15 DIAGNOSIS — G4700 Insomnia, unspecified: Secondary | ICD-10-CM

## 2018-05-15 DIAGNOSIS — F17210 Nicotine dependence, cigarettes, uncomplicated: Secondary | ICD-10-CM

## 2018-05-15 DIAGNOSIS — F1290 Cannabis use, unspecified, uncomplicated: Secondary | ICD-10-CM

## 2018-05-15 NOTE — Progress Notes (Addendum)
[facility] MD Progress Note  05/15/2018 1:56 PM Briana Sexton  MRN:  [id number]  Subjective: Briana Sexton reports, "I am doing pretty good today, doing well on the medications. I came to the hospital because I had a nervous breakdown that led to me having homicidal thoughts towards my mother. That was very unusual for me because I love my mother. I think the whole thing was triggered by stress from school & having to do too much house chores at home. I'm currently learning to take care of myself".  Briana Sexton is seen, chart reviewed. The chart findings discussed with the treatment team. She presents alert & oriented x 3. She is aware of situation. She is visible on the unit, attending group sessions & activities. She re-narrated her story of what led to her hospitalization. She described how she got stressed out from doing too much school work & having to take care of all the house chores. She says the nervous breakdown led to her having an intrusive homicidal thoughts towards her mother then suicidal thoughts towards herself. She says this is unlike her. She adds being here is helping her already as she is learning coping skills. She says she doing well on her medications. She denies any SIHI, AVH, delusional thoughts or paranoia. She does not present as if responding to any internal stimuli. Briana Sexton has agreed to continue current plan of care as already in progress.  Principal Problem: MDD Diagnosis:   Patient Active Problem List   Diagnosis Date Noted  . MDD (major depressive disorder) [F32.9] 05/12/2018  . Tobacco use [Z72.0] 10/09/2017  . Morbid obesity ([place]) [E66.01] 10/09/2017  . Calculus of gallbladder without cholecystitis without obstruction [K80.20]    Total Time spent with patient: 15 minutes  Past Psychiatric History: See H&P.  Past Medical History:  Past Medical History:  Diagnosis Date  . Abnormal uterine bleeding   . Depression   . GERD (gastroesophageal reflux disease)   . Headache     MIGRAINES  . Pre-diabetes     Past Surgical History:  Procedure Laterality Date  . CHOLECYSTECTOMY N/A 01/30/2017   Procedure: LAPAROSCOPIC CHOLECYSTECTOMY;  Surgeon: Vickie Epley, MD;  Location: [facility] ORS;  Service: General;  Laterality: N/A;  . NO PAST SURGERIES     Family History:  Family History  Problem Relation Age of Onset  . Hypertension Sister   . Diabetes Maternal Uncle   . Thyroid disease Mother    Family Psychiatric  History:  Social History:  Social History   Substance and Sexual Activity  Alcohol Use Yes  . Alcohol/week: 0.0 standard drinks   Comment: OCC-ONCE WEEKLY     Social History   Substance and Sexual Activity  Drug Use Yes  . Types: Marijuana   Comment: VERY RARE    Social History   Socioeconomic History  . Marital status: Single    Spouse name: Not on file  . Number of children: Not on file  . Years of education: Not on file  . Highest education level: Not on file  Occupational History  . Not on file  Social Needs  . Financial resource strain: Not on file  . Food insecurity:    Worry: Not on file    Inability: Not on file  . Transportation needs:    Medical: Not on file    Non-medical: Not on file  Tobacco Use  . Smoking status: Current Every Day Smoker    Packs/day: 0.50    Years: 3.00  Pack years: 1.50    Types: Cigarettes  . Smokeless tobacco: Never Used  Substance and Sexual Activity  . Alcohol use: Yes    Alcohol/week: 0.0 standard drinks    Comment: OCC-ONCE WEEKLY  . Drug use: Yes    Types: Marijuana    Comment: VERY RARE  . Sexual activity: Not Currently  Lifestyle  . Physical activity:    Days per week: Not on file    Minutes per session: Not on file  . Stress: Not on file  Relationships  . Social connections:    Talks on phone: Not on file    Gets together: Not on file    Attends religious service: Not on file    Active member of club or organization: Not on file    Attends meetings of clubs or  organizations: Not on file    Relationship status: Not on file  Other Topics Concern  . Not on file  Social History Narrative  . Not on file   Additional Social History:    Pain Medications: see MAR Prescriptions: see MAR Over the Counter: see MAR History of alcohol / drug use?: Yes Longest period of sobriety (when/how long): patient has no history of problematic use and states that she has not been drinking or using ion a reguler basis Name of Substance 1: alcohol  1 - Age of First Use: 14 1 - Amount (size/oz): 4 mixed drinks 1 - Frequency: 1 weekend a month 1 - Duration: since onset 1 - Last Use / Amount: last use was [place] Name of Substance 2: cannabis 2 - Age of First Use: unknown 2 - Amount (size/oz): undetermined amount 2 - Frequency: occasionally 2 - Duration: since onset(since onset) 2 - Last Use / Amount: undetermine amount   Sleep: Improving  Appetite:  Improving  Current Medications: Current Facility-Administered Medications  Medication Dose Route Frequency Provider Last Rate Last Dose  . alum & mag hydroxide-simeth (MAALOX/MYLANTA) 200-200-20 MG/5ML suspension 30 mL  30 mL Oral Q4H PRN Derrill Center, NP      . escitalopram (LEXAPRO) tablet 10 mg  10 mg Oral Daily Cobos, Myer Peer, MD   10 mg at 05/15/18 0818  . hydrOXYzine (ATARAX/VISTARIL) tablet 50 mg  50 mg Oral TID PRN Derrill Center, NP   50 mg at 05/14/18 2236  . LORazepam (ATIVAN) tablet 0.5 mg  0.5 mg Oral Q6H PRN Cobos, Myer Peer, MD   0.5 mg at 05/13/18 1100  . magnesium hydroxide (MILK OF MAGNESIA) suspension 30 mL  30 mL Oral Daily PRN Derrill Center, NP        Lab Results:  Results for orders placed or performed during the hospital encounter of 05/12/18 (from the past 48 hour(s))  Urinalysis, Complete w Microscopic     Status: Abnormal   Collection Time: 05/13/18  6:18 PM  Result Value Ref Range   Color, Urine YELLOW YELLOW   APPearance CLEAR CLEAR   Specific Gravity, Urine 1.015 1.005  - 1.030   pH 6.0 5.0 - 8.0   Glucose, UA NEGATIVE NEGATIVE mg/dL   Hgb urine dipstick LARGE (A) NEGATIVE   Bilirubin Urine NEGATIVE NEGATIVE   Ketones, ur NEGATIVE NEGATIVE mg/dL   Protein, ur NEGATIVE NEGATIVE mg/dL   Nitrite NEGATIVE NEGATIVE   Leukocytes, UA TRACE (A) NEGATIVE   RBC / HPF 11-20 0 - 5 RBC/hpf   WBC, UA 6-10 0 - 5 WBC/hpf   Bacteria, UA RARE (A) NONE SEEN  Squamous Epithelial / LPF 0-5 0 - 5   Mucus PRESENT     Comment: Performed at [facility], [place] [address] [place], [place] [address]  Rapid urine drug screen (hospital performed)     Status: None   Collection Time: 05/13/18  6:18 PM  Result Value Ref Range   Opiates NONE DETECTED NONE DETECTED   Cocaine NONE DETECTED NONE DETECTED   Benzodiazepines NONE DETECTED NONE DETECTED   Amphetamines NONE DETECTED NONE DETECTED   Tetrahydrocannabinol NONE DETECTED NONE DETECTED   Barbiturates NONE DETECTED NONE DETECTED    Comment: (NOTE) DRUG SCREEN FOR MEDICAL PURPOSES ONLY.  IF CONFIRMATION IS NEEDED FOR ANY PURPOSE, NOTIFY LAB WITHIN 5 DAYS. LOWEST DETECTABLE LIMITS FOR URINE DRUG SCREEN Drug Class                     Cutoff (ng/mL) Amphetamine and metabolites    1000 Barbiturate and metabolites    200 Benzodiazepine                 573 Tricyclics and metabolites     300 Opiates and metabolites        300 Cocaine and metabolites        300 THC                            50 Performed at [facility], [place] [address], [place], [place] [address]   Pregnancy, urine     Status: None   Collection Time: 05/13/18  6:18 PM  Result Value Ref Range   Preg Test, Ur NEGATIVE NEGATIVE    Comment:        THE SENSITIVITY OF THIS METHODOLOGY IS >20 mIU/mL. Performed at [facility], [place] [address], [place], [place] [address]   Basic metabolic panel     Status: Abnormal   Collection Time: 05/13/18  6:22 PM  Result Value Ref Range   Sodium 138 135 - 145 mmol/L    Potassium 4.0 3.5 - 5.1 mmol/L   Chloride 104 98 - 111 mmol/L   CO2 26 22 - 32 mmol/L   Glucose, Bld 121 (H) 70 - 99 mg/dL   BUN 10 6 - 20 mg/dL   Creatinine, Ser 0.83 0.44 - 1.00 mg/dL   Calcium 9.0 8.9 - 10.3 mg/dL   GFR calc non Af Amer >60 >60 mL/min   GFR calc Af Amer >60 >60 mL/min    Comment: (NOTE) The eGFR has been calculated using the CKD EPI equation. This calculation has not been validated in all clinical situations. eGFR's persistently <60 mL/min signify possible Chronic Kidney Disease.    Anion gap 8 5 - 15    Comment: Performed at [facility], [place] [address] [place], [place] [address]  CBC with Differential/Platelet     Status: None   Collection Time: 05/13/18  6:22 PM  Result Value Ref Range   WBC 10.4 4.0 - 10.5 K/uL   RBC 4.30 3.87 - 5.11 MIL/uL   Hemoglobin 12.8 12.0 - 15.0 g/dL   HCT 37.9 36.0 - 46.0 %   MCV 88.1 78.0 - 100.0 fL   MCH 29.8 26.0 - 34.0 pg   MCHC 33.8 30.0 - 36.0 g/dL   RDW 12.9 11.5 - 15.5 %   Platelets 373 150 - 400 K/uL   Neutrophils Relative % 69 %   Neutro Abs 7.1 1.7 - 7.7  K/uL   Lymphocytes Relative 25 %   Lymphs Abs 2.6 0.7 - 4.0 K/uL   Monocytes Relative 5 %   Monocytes Absolute 0.5 0.1 - 1.0 K/uL   Eosinophils Relative 1 %   Eosinophils Absolute 0.1 0.0 - 0.7 K/uL   Basophils Relative 0 %   Basophils Absolute 0.0 0.0 - 0.1 K/uL    Comment: Performed at [facility], [place] [address], [place], [place] [address]  Lipid panel     Status: Abnormal   Collection Time: 05/13/18  6:22 PM  Result Value Ref Range   Cholesterol 147 0 - 200 mg/dL   Triglycerides 135 <150 mg/dL   HDL 33 (L) >40 mg/dL   Total CHOL/HDL Ratio 4.5 RATIO   VLDL 27 0 - 40 mg/dL   LDL Cholesterol 87 0 - 99 mg/dL    Comment:        Total Cholesterol/HDL:CHD Risk Coronary Heart Disease Risk Table                     Men   Women  1/2 Average Risk   3.4   3.3  Average Risk       5.0   4.4  2 X Average Risk   9.6    7.1  3 X Average Risk  23.4   11.0        Use the calculated Patient Ratio above and the CHD Risk Table to determine the patient's CHD Risk.        ATP III CLASSIFICATION (LDL):  <100     mg/dL   Optimal  100-129  mg/dL   Near or Above                    Optimal  130-159  mg/dL   Borderline  160-189  mg/dL   High  >190     mg/dL   Very High Performed at [place] [address], [place], [place] [address]   TSH     Status: None   Collection Time: 05/14/18  6:29 AM  Result Value Ref Range   TSH 1.069 0.350 - 4.500 uIU/mL    Comment: Performed by a 3rd Generation assay with a functional sensitivity of <=0.01 uIU/mL. Performed at [facility], [place] [address], [place],  [address]    Blood Alcohol level:  No results found for: [facility]  Metabolic Disorder Labs: No results found for: HGBA1C, MPG No results found for: PROLACTIN Lab Results  Component Value Date   CHOL 147 05/13/2018   TRIG 135 05/13/2018   HDL 33 (L) 05/13/2018   CHOLHDL 4.5 05/13/2018   VLDL 27 05/13/2018   LDLCALC 87 05/13/2018   Physical Findings: AIMS: Facial and Oral Movements Muscles of Facial Expression: None, normal Lips and Perioral Area: None, normal Jaw: None, normal Tongue: None, normal,Extremity Movements Upper (arms, wrists, hands, fingers): None, normal Lower (legs, knees, ankles, toes): None, normal, Trunk Movements Neck, shoulders, hips: None, normal, Overall Severity Severity of abnormal movements (highest score from questions above): None, normal Incapacitation due to abnormal movements: None, normal Patient's awareness of abnormal movements (rate only patient's report): No Awareness, Dental Status Current problems with teeth and/or dentures?: No Does patient usually wear dentures?: No  CIWA:  CIWA-Ar Total: 1 COWS:  COWS Total Score: 1  Musculoskeletal: Strength & Muscle Tone: within normal limits Gait & Station: normal Patient leans:  N/A  Psychiatric Specialty Exam: Physical Exam  Nursing note and vitals reviewed.   ROS denies chest pain, no shortness of breath, no nausea, no vomiting  Blood pressure 110/68, pulse 74, temperature 98.2 F (36.8 C), temperature source Oral, resp. rate 20, height 5' 6"  (1.676 m), weight 117.9 kg.Body mass index is 41.97 kg/m.  General Appearance: Well Groomed  Eye Contact:  Good  Speech:  Normal Rate  Volume:  Normal  Mood:  Currently reports improving mood  Affect:  Vaguely anxious/more reactive, improves during session  Thought Process:  Linear and Descriptions of Associations: Intact  Orientation:  Full (Time, Place, and Person)  Thought Content:  Currently denies hallucinations and does not appear internally preoccupied( see above)  Suicidal Thoughts:  No-currently denies suicidal or self-injurious ideations and contracts for safety on unit, as above currently also denies any homicidal or violent plan or intention and specifically denies any plan or intention of hurting her mother. Contracts for safety on unit  Homicidal Thoughts:  No  Memory:  Recent and remote grossly intact  Judgement:  Other:  Improving  Insight:  Fair and Improving  Psychomotor Activity:  Normal  Concentration:  Concentration: Good and Attention Span: Good  Recall:  Good  Fund of Knowledge:  Good  Language:  Good  Akathisia:  Negative  Handed:  Right  AIMS (if indicated):     Assets:  Desire for Improvement Resilience  ADL's:  Intact  Cognition:  WNL  Sleep:  Number of Hours: 6   Treatment Plan Summary: Daily contact with patient to assess and evaluate symptoms and progress in treatment.  - Continue inpatient hospitalization.  - Will continue today 05/15/2018 plan as below except where it is noted.  Depression     - Continue Lexapro 10 mg po daily for depression   Anxiety/Insomnia.     - Continue Ativan 0.5 mg po every 6 hours PRN.     - Continue Vistaril 50 mg po tid prn  Encourage group  and milieu participation to work on coping skills and symptom reduction.  Treatment team working on disposition planning.  Briana Spar, NP, PMHNP, FNP-BC. 05/15/2018, 1:56 PMPatient ID: Briana Sexton, female   DOB: 01/10/1991, 27 y.o.   MRN: [id number] .Marland KitchenAgree with NP Progress Note

## 2018-05-15 NOTE — Progress Notes (Signed)
Nutrition Education Note  Pt attended group focusing on general, healthful nutrition education.  RD emphasized the importance of eating regular meals and snacks throughout the day. Consuming sugar-free beverages and incorporating fruits and vegetables into diet when possible. Provided examples of healthy snacks. Patient encouraged to leave group with a goal to improve nutrition/healthy eating. Encouraged daily exercise and/or time spent outside.   Diet Order:  Diet Order            Diet regular Room service appropriate? Yes; Fluid consistency: Thin  Diet effective now             Pt is also offered choice of unit snacks mid-morning and mid-afternoon.  Pt is eating as desired.   If additional nutrition issues arise, please consult RD.    Trenton GammonJessica Odel Schmid, MS, RD, LDN, [facility] Inpatient Clinical Dietitian Pager # [phone] After hours/weekend pager # [phone]

## 2018-05-15 NOTE — Progress Notes (Signed)
D: Patient is alert, oriented, pleasant, and cooperative. Denies SI, HI, AVH, and verbally contracts for safety. Patient reports she slept fair with sleeping medication that she requested. Patient reported her appetite is good, energy level is normal, and her concentration is good. Patient rates her depression 2/10, hopelessness 8/10, and anxiety 6/10. Patient reports she is not having any physical problems/pain. Patient states her goal for today is "figuring out how to deal with stress as it comes and meds for it". Patient states she will "talk to the doctor and journal" to help meet her goal.   A: Scheduled medications administered per MD order. Support provided. Routine safety checks every 15 minutes. Patient stated understanding to tell nurse about any new physical symptoms she experiences. Patient understands to tell staff if she needs anything.    R: No adverse drug reactions noted. Patient verbally contracts for safety. Patient remains safe at this time and will continue to monitor.

## 2018-05-15 NOTE — [facility] Group Notes (Signed)
[facility] Group Notes:  (Nursing/MHT/Case Management/Adjunct)  Date:  05/15/2018  Time: 4:00 PM  Type of Therapy:  Nurse Education  Participation Level:  Active  Participation Quality:  Appropriate and Supportive  Affect:  Appropriate  Cognitive:  Alert and Appropriate  Insight:  Appropriate  Engagement in Group:  Developing/Improving and Engaged  Modes of Intervention:  Discussion and Education  Summary of Progress/Problems: Patient is progressing in plan of treatment.  Dewayne Shorterlyssa  Setareh Rom 05/15/2018, 5:26 PM

## 2018-05-15 NOTE — Progress Notes (Signed)
Adult Psychoeducational Group Note  Date:  05/15/2018 Time:  4:57 AM  Group Topic/Focus:  Wrap-Up Group:   The focus of this group is to help patients review their daily goal of treatment and discuss progress on daily workbooks.  Participation Level:  Active  Participation Quality:  Appropriate  Affect:  Appropriate  Cognitive:  Appropriate  Insight: Appropriate and Good  Engagement in Group:  Engaged  Modes of Intervention:  Discussion  Additional Comments:  Her day was a 7. The one thing positive that happen to her today she working on her coping skills. She want to be able to write down things she will need later.  Charna Busmanobinson, [place] 05/15/2018, 4:57 AM

## 2018-05-15 NOTE — [facility] Group Notes (Signed)
[facility] LCSW Group Therapy Note  Date/Time: 05/15/18, 1315  Type of Therapy/Topic:  Group Therapy:  Balance in Life  Participation Level:  moderate  Description of Group:    This group will address the concept of balance and how it feels and looks when one is unbalanced. Patients will be encouraged to process areas in their lives that are out of balance, and identify reasons for remaining unbalanced. Facilitators will guide patients utilizing problem- solving interventions to address and correct the stressor making their life unbalanced. Understanding and applying boundaries will be explored and addressed for obtaining  and maintaining a balanced life. Patients will be encouraged to explore ways to assertively make their unbalanced needs known to significant others in their lives, using other group members and facilitator for support and feedback.  Therapeutic Goals: 1. Patient will identify two or more emotions or situations they have that consume much of in their lives. 2. Patient will identify signs/triggers that life has become out of balance:  3. Patient will identify two ways to set boundaries in order to achieve balance in their lives:  4. Patient will demonstrate ability to communicate their needs through discussion and/or role plays  Summary of Patient Progress: Pt identified family, work, and school as areas that are out of balance in her life.  Pt made several comments during group discussion about how to set boundaries and otherwise respond to areas that have become out of balance.          Therapeutic Modalities:   Cognitive Behavioral Therapy Solution-Focused Therapy Assertiveness Training  Daleen SquibbGreg Yaritza Leist, KentuckyLCSW

## 2018-05-15 NOTE — Plan of Care (Signed)
  Problem: Activity: Goal: Interest or engagement in activities will improve Outcome: Progressing   Problem: Physical Regulation: Goal: Ability to maintain clinical measurements within normal limits will improve Outcome: Progressing   Problem: Safety: Goal: Periods of time without injury will increase Outcome: Progressing   Problem: Education: Goal: Ability to make informed decisions regarding treatment will improve Outcome: Progressing   Problem: Medication: Goal: Compliance with prescribed medication regimen will improve Outcome: Progressing  Patient is getting along well in milieu. Patient vitals are in normal limits. Patient learned with teach back how to take prn anxiety medication as well as the side effects of her anxiety medication. Patient remains safe on the unit.

## 2018-05-16 ENCOUNTER — Ambulatory Visit: Payer: Self-pay | Admitting: Psychology

## 2018-05-16 NOTE — Plan of Care (Signed)
  Problem: Education: Goal: Knowledge of [facility] Education information/materials will improve Outcome: Progressing   

## 2018-05-16 NOTE — [facility] Group Notes (Signed)
  [facility]  Date/Time: 05/16/18, 1315  Type of Therapy/Topic:  Group Therapy:  Emotion Regulation  Participation Level:  Active   Mood:pleasant  Description of Group:    The purpose of this group is to assist patients in learning to regulate negative emotions and experience positive emotions. Patients will be guided to discuss ways in which they have been vulnerable to their negative emotions. These vulnerabilities will be juxtaposed with experiences of positive emotions or situations, and patients challenged to use positive emotions to combat negative ones. Special emphasis will be placed on coping with negative emotions in conflict situations, and patients will process healthy conflict resolution skills.  Therapeutic Goals: 1. Patient will identify two positive emotions or experiences to reflect on in order to balance out negative emotions:  2. Patient will label two or more emotions that they find the most difficult to experience:  3. Patient will be able to demonstrate positive conflict resolution skills through discussion or role plays:   Summary of Patient Progress:Pt shared that fear/anxiety are emotions that are difficult to experience.  Pt made several comments during group discussion about positive ways to manage difficult emotions.       Therapeutic Modalities:   Cognitive Behavioral Therapy Feelings Identification Dialectical Behavioral Therapy  Daleen SquibbGreg Gloyd Happ, LCSW

## 2018-05-16 NOTE — Progress Notes (Signed)
[facility] MD Progress Note  05/16/2018 1:32 PM [place]  MRN:  [id number]  Subjective: Patient reports she is feeling better than on admission.  Describes some lingering anxiety, ruminations, but less intense and less intrusive than on admission. Currently denies medication side effects. Objective : I have discussed case with treatment team and have met with patient 27 year old single female, presented to hospital voluntarily reporting homicidal ideations towards mother which she describes as ego-dystonic, obsessive, without any actual intention.  Today patient presents alert, attentive, describing partially improved mood and decreasing anxiety. She is less focused and seems less distressed/ruminative regarding obsessive ideations.(On admission have reported homicidal ideations towards mother described as a ego-dystonic, obsessive, without any actual intent). She is still focused on family issues/dynamics.  States that family members/mother tend to be passive so that home/household responsibilities tend to fall on her. At this time denies medication side effects-she is on Lexapro.  No disruptive or agitated behaviors on unit.  Some group participation.  Cooperative/pleasant on approach.  Principal Problem: MDD Diagnosis:   Patient Active Problem List   Diagnosis Date Noted  . MDD (major depressive disorder) [F32.9] 05/12/2018  . Tobacco use [Z72.0] 10/09/2017  . Morbid obesity ([place]) [E66.01] 10/09/2017  . Calculus of gallbladder without cholecystitis without obstruction [K80.20]    Total Time spent with patient: 15 minutes  Past Psychiatric History: See H&P.  Past Medical History:  Past Medical History:  Diagnosis Date  . Abnormal uterine bleeding   . Depression   . GERD (gastroesophageal reflux disease)   . Headache    MIGRAINES  . Pre-diabetes     Past Surgical History:  Procedure Laterality Date  . CHOLECYSTECTOMY N/A 01/30/2017   Procedure: LAPAROSCOPIC CHOLECYSTECTOMY;   Surgeon: Vickie Epley, MD;  Location: [facility] ORS;  Service: General;  Laterality: N/A;  . NO PAST SURGERIES     Family History:  Family History  Problem Relation Age of Onset  . Hypertension Sister   . Diabetes Maternal Uncle   . Thyroid disease Mother    Family Psychiatric  History:  Social History:  Social History   Substance and Sexual Activity  Alcohol Use Yes  . Alcohol/week: 0.0 standard drinks   Comment: OCC-ONCE WEEKLY     Social History   Substance and Sexual Activity  Drug Use Yes  . Types: Marijuana   Comment: VERY RARE    Social History   Socioeconomic History  . Marital status: Single    Spouse name: Not on file  . Number of children: Not on file  . Years of education: Not on file  . Highest education level: Not on file  Occupational History  . Not on file  Social Needs  . Financial resource strain: Not on file  . Food insecurity:    Worry: Not on file    Inability: Not on file  . Transportation needs:    Medical: Not on file    Non-medical: Not on file  Tobacco Use  . Smoking status: Current Every Day Smoker    Packs/day: 0.50    Years: 3.00    Pack years: 1.50    Types: Cigarettes  . Smokeless tobacco: Never Used  Substance and Sexual Activity  . Alcohol use: Yes    Alcohol/week: 0.0 standard drinks    Comment: OCC-ONCE WEEKLY  . Drug use: Yes    Types: Marijuana    Comment: VERY RARE  . Sexual activity: Not Currently  Lifestyle  . Physical activity:    Days  per week: Not on file    Minutes per session: Not on file  . Stress: Not on file  Relationships  . Social connections:    Talks on phone: Not on file    Gets together: Not on file    Attends religious service: Not on file    Active member of club or organization: Not on file    Attends meetings of clubs or organizations: Not on file    Relationship status: Not on file  Other Topics Concern  . Not on file  Social History Narrative  . Not on file   Additional Social  History:    Pain Medications: see MAR Prescriptions: see MAR Over the Counter: see MAR History of alcohol / drug use?: Yes Longest period of sobriety (when/how long): patient has no history of problematic use and states that she has not been drinking or using ion a reguler basis Name of Substance 1: alcohol  1 - Age of First Use: 14 1 - Amount (size/oz): 4 mixed drinks 1 - Frequency: 1 weekend a month 1 - Duration: since onset 1 - Last Use / Amount: last use was [place] Name of Substance 2: cannabis 2 - Age of First Use: unknown 2 - Amount (size/oz): undetermined amount 2 - Frequency: occasionally 2 - Duration: since onset(since onset) 2 - Last Use / Amount: undetermine amount   Sleep: Good  Appetite:  Good  Current Medications: Current Facility-Administered Medications  Medication Dose Route Frequency Provider Last Rate Last Dose  . alum & mag hydroxide-simeth (MAALOX/MYLANTA) 200-200-20 MG/5ML suspension 30 mL  30 mL Oral Q4H PRN Derrill Center, NP      . escitalopram (LEXAPRO) tablet 10 mg  10 mg Oral Daily Cobos, Myer Peer, MD   10 mg at 05/16/18 0803  . hydrOXYzine (ATARAX/VISTARIL) tablet 50 mg  50 mg Oral TID PRN Derrill Center, NP   50 mg at 05/14/18 2236  . LORazepam (ATIVAN) tablet 0.5 mg  0.5 mg Oral Q6H PRN Cobos, Myer Peer, MD   0.5 mg at 05/15/18 2213  . magnesium hydroxide (MILK OF MAGNESIA) suspension 30 mL  30 mL Oral Daily PRN Derrill Center, NP        Lab Results:  No results found for this or any previous visit (from the past 17 hour(s)). Blood Alcohol level:  No results found for: [facility]  Metabolic Disorder Labs: No results found for: HGBA1C, MPG No results found for: PROLACTIN Lab Results  Component Value Date   CHOL 147 05/13/2018   TRIG 135 05/13/2018   HDL 33 (L) 05/13/2018   CHOLHDL 4.5 05/13/2018   VLDL 27 05/13/2018   LDLCALC 87 05/13/2018   Physical Findings: AIMS: Facial and Oral Movements Muscles of Facial Expression: None,  normal Lips and Perioral Area: None, normal Jaw: None, normal Tongue: None, normal,Extremity Movements Upper (arms, wrists, hands, fingers): None, normal Lower (legs, knees, ankles, toes): None, normal, Trunk Movements Neck, shoulders, hips: None, normal, Overall Severity Severity of abnormal movements (highest score from questions above): None, normal Incapacitation due to abnormal movements: None, normal Patient's awareness of abnormal movements (rate only patient's report): No Awareness, Dental Status Current problems with teeth and/or dentures?: No Does patient usually wear dentures?: No  CIWA:  CIWA-Ar Total: 1 COWS:  COWS Total Score: 1  Musculoskeletal: Strength & Muscle Tone: within normal limits Gait & Station: normal Patient leans: N/A  Psychiatric Specialty Exam: Physical Exam  Nursing note and vitals reviewed.   ROS  denies chest pain, no shortness of breath, no nausea, no vomiting  Blood pressure 99/76, pulse 80, temperature 98 F (36.7 C), temperature source Oral, resp. rate 20, height 5' 6"  (1.676 m), weight 117.9 kg.Body mass index is 41.97 kg/m.  General Appearance: Well Groomed  Eye Contact:  Good  Speech:  Normal Rate  Volume:  Normal  Mood:  Improving mood  Affect:  Affect more reactive, less anxious  Thought Process:  Linear and Descriptions of Associations: Intact  Orientation:  Full (Time, Place, and Person)  Thought Content:  Currently denies hallucinations and does not appear internally preoccupied( see above)  Suicidal Thoughts:  No-no suicidal or self-injurious ideations.  Has described homicidal ideations towards mother as ego-dystonic and without actual intention.  States these ideations have decreased since admission.  Denies any actual plan or intention of harming her mother or anybody else  Homicidal Thoughts:  No  Memory:  Recent and remote grossly intact  Judgement:  Other:  Improving  Insight:  Fair and Improving  Psychomotor Activity:   Normal  Concentration:  Concentration: Good and Attention Span: Good  Recall:  Good  Fund of Knowledge:  Good  Language:  Good  Akathisia:  Negative  Handed:  Right  AIMS (if indicated):     Assets:  Desire for Improvement Resilience  ADL's:  Intact  Cognition:  WNL  Sleep:  Number of Hours: 6.75    Assessment-27 year old single female, presented to hospital voluntarily reporting homicidal ideations towards mother which she describes as ego-dystonic, obsessive, without any actual intention.  She also describes other obsessive ideations and compulsive behaviors, such as checking and rechecking doors and stove at home. Patient is currently presenting with partially improved mood, decreasing anxiety, decreased intensity of ruminations/obsessive ideations.  Currently denies any homicidal plan or intention towards mother or anybody else.  Thus far tolerating Lexapro well.  We discussed titrating Lexapro further but prefers to continue current dose at this time to minimize risk of side effects  Treatment Plan Summary: Treatment plan reviewed as below today 9/20 Daily contact with patient to assess and evaluate symptoms and progress in treatment. Encourage group and milieu participation to work on coping skills and symptom reduction Continue Lexapro 10 mg daily for depression, OCD type symptoms Continue Ativan 0.5 mg every 6 hours PRN for anxiety Treatment team working on disposition planning options  Jenne Campus, MD   05/16/2018, 1:32 PM  Patient ID: Petrice Beedy, female   DOB: 07-06-1991, 27 y.o.   MRN: [id number]

## 2018-05-16 NOTE — Progress Notes (Signed)
Recreation Therapy Notes  Date: 9.20.19 Time: 0930 Location: [address] Hall Dayroom  Group Topic: Stress Management  Goal Area(s) Addresses:  Patient will verbalize importance of using healthy stress management.  Patient will identify positive emotions associated with healthy stress management.   Intervention: Stress Management  Activity : Progressive Muscle Relaxation.  LRT introduced the stress management technique of progressive muscle relaxation.  LRT read Sexton script and lead patients through Sexton series of techniques that guided them to tense each muscle group individually and then relax them.  Patients were to listen as script was read to engage in activity.  Education:  Stress Management, Discharge Planning.   Education Outcome: Acknowledges edcuation/In group clarification offered/Needs additional education  Clinical Observations/Feedback: Pt did not attend group.    Briana Sexton, LRT/CTRS         Briana Sexton 05/16/2018 12:26 PM 

## 2018-05-16 NOTE — Progress Notes (Signed)
Adult Psychoeducational Group Note  Date:  05/16/2018 Time:  5:27 AM  Group Topic/Focus:  Wrap-Up Group:   The focus of this group is to help patients review their daily goal of treatment and discuss progress on daily workbooks.  Participation Level:  Active  Participation Quality:  Appropriate  Affect:  Appropriate  Cognitive:  Appropriate  Insight: Appropriate and Good  Engagement in Group:  Engaged  Modes of Intervention:  Discussion  Additional Comments:  Her day was 8. She had a good day laughing.  Charna Busmanobinson, Keyaan Lederman Long 05/16/2018, 5:27 AM

## 2018-05-16 NOTE — Progress Notes (Signed)
D: Patient denies SI, HI or AVH. Patient presents as anxious and animated but pleasant and cooperative.  Pt. States that she is working on a list of her triggers and coping skills in preparation for discharge so she doesn't end up back in the same situation.  Pt. States that she knows she needs to be on medication and that they will take some time to work but she is committed to that. Pt. Denies any physical complaints and reports that her appetite and sleep are good.   She is visualized on the unit interacting with her peers and attending/participating in group.    A: Patient given emotional support from RN. Patient encouraged to come to staff with concerns and/or questions. Patient's medication routine continued. Patient's orders and plan of care reviewed.   R: Patient remains appropriate and cooperative. Will continue to monitor patient q15 minutes for safety.

## 2018-05-16 NOTE — Progress Notes (Signed)
D: Patient observed **up and visible in the dayroom tonight,. Patient states she has had a good day and her goal was to look at her stressors. "I feel nervous about when I leave her. There's just a lot on me." Patient's affect blunted, anxious with congruent mood. Pleasant and cooperative.  Denies pain, physical complaints.   A: Medicated per orders, prn ativan given for anxiety. Medication education provided. Level III obs in place for safety. Emotional support offered. Patient encouraged to complete Suicide Safety Plan before discharge. Encouraged to attend and participate in unit programming.   R: Patient verbalizes understanding of POC. On reassess, patient reports some decrease in anxiety and states she feels she will be able to rest tonight. Patient denies SI/HI/AVH and remains safe on level III obs. Will continue to monitor throughout the night.

## 2018-05-16 NOTE — Progress Notes (Signed)
Adult Psychoeducational Group Note  Date:  05/16/2018 Time:  11:13 PM  Group Topic/Focus:  Wrap-Up Group:   The focus of this group is to help patients review their daily goal of treatment and discuss progress on daily workbooks.  Participation Level:  Active  Participation Quality:  Appropriate  Affect:  Appropriate  Cognitive:  Appropriate  Insight: Appropriate  Engagement in Group:  Engaged  Modes of Intervention:  Discussion  Additional Comments:  Pt stated her goal was to figure out how to handle stressors once she is discharged. Pt has learned to identify her triggers and will deal with them differently to prevent unneeded stress.  Pt rated her day at a 8/10.  Breana Litts 05/16/2018, 11:13 PM

## 2018-05-16 NOTE — Plan of Care (Signed)
  Problem: Education: Goal: Knowledge of [facility] Education information/materials will improve Outcome: Completed/Met Goal: Verbalization of understanding the information provided will improve Outcome: Completed/Met   

## 2018-05-16 NOTE — [facility] Group Notes (Signed)
Adult Psychoeducational Group Note  Date:  05/16/2018 Time:  12:07 PM  Group Topic/Focus:  Wellness Toolbox:   The focus of this group is to discuss various aspects of wellness, balancing those aspects and exploring ways to increase the ability to experience wellness.  Patients will create a wellness toolbox for use upon discharge.  Participation Level:  Active  Participation Quality:  Appropriate  Affect:  Appropriate  Cognitive:  Alert  Insight: Appropriate  Engagement in Group:  Engaged  Modes of Intervention:  Orientation  Additional Comments:  Pt participated in group activity.  Dellia NimsJaquesha M Anjelo Pullman 05/16/2018, 12:07 PM

## 2018-05-16 NOTE — Progress Notes (Signed)
D Pt is observed UAL out in the milieu on the [address] hall...she is quiet. She avoids eye contact. She avoids speaking to staff, yet is seen by this Clinical research associatewriter interacting appropriately with other patients on her hall.    A She completed her daily assessment and on his she wrote  She deneid SI today and she rated her depression, hopelessness and anxiety " 2/2/5", respectively. She is working on identifying her triggers.    R Safety is in place.

## 2018-05-16 NOTE — [facility] Group Notes (Signed)
Pt was invited but did not attend orientation/goals group. 

## 2018-05-17 DIAGNOSIS — F329 Major depressive disorder, single episode, unspecified: Principal | ICD-10-CM

## 2018-05-17 NOTE — Progress Notes (Signed)
[facility] MD Progress Note  05/17/2018 11:35 AM Briana Sexton  MRN:  [id number] Subjective: Patient is seen and examined.  Patient is a 27 year old female with a reported past psychiatric history significant of major depression and generalized anxiety as well is obsessive-compulsive symptomatology.  She is seen in follow-up.  She states she feels better.  She believes the Lexapro has helped.  She stated that she had had intrusive thoughts on admission about harming her mother.  She was under significant stress at home from work, home issues, her sister and her mother.  Stated that that had improved since she is been here.  She stated the medications are decreasing her anxiety, and helping her mood.  She still is focused on her family dynamics between her sister, herself and her mother.  We discussed coping skills with regard to that.  She denied any current suicidal or homicidal ideation.  She denied any side effects to her current medications.  She stated that she felt as though she was about a 7 out of 10 with 10 out of 10 being normal. Principal Problem: <principal problem not specified> Diagnosis:   Patient Active Problem List   Diagnosis Date Noted  . MDD (major depressive disorder) [F32.9] 05/12/2018  . Tobacco use [Z72.0] 10/09/2017  . Morbid obesity ([facility]) [E66.01] 10/09/2017  . Calculus of gallbladder without cholecystitis without obstruction [K80.20]    Total Time spent with patient: 15 minutes  Past Psychiatric History: See admission H&P  Past Medical History:  Past Medical History:  Diagnosis Date  . Abnormal uterine bleeding   . Depression   . GERD (gastroesophageal reflux disease)   . Headache    MIGRAINES  . Pre-diabetes     Past Surgical History:  Procedure Laterality Date  . CHOLECYSTECTOMY N/A 01/30/2017   Procedure: LAPAROSCOPIC CHOLECYSTECTOMY;  Surgeon: Ancil Linsey, MD;  Location: [facility] ORS;  Service: General;  Laterality: N/A;  . NO PAST SURGERIES     Family  History:  Family History  Problem Relation Age of Onset  . Hypertension Sister   . Diabetes Maternal Uncle   . Thyroid disease Mother    Family Psychiatric  History: See admission H&P Social History:  Social History   Substance and Sexual Activity  Alcohol Use Yes  . Alcohol/week: 0.0 standard drinks   Comment: OCC-ONCE WEEKLY     Social History   Substance and Sexual Activity  Drug Use Yes  . Types: Marijuana   Comment: VERY RARE    Social History   Socioeconomic History  . Marital status: Single    Spouse name: Not on file  . Number of children: Not on file  . Years of education: Not on file  . Highest education level: Not on file  Occupational History  . Not on file  Social Needs  . Financial resource strain: Not on file  . Food insecurity:    Worry: Not on file    Inability: Not on file  . Transportation needs:    Medical: Not on file    Non-medical: Not on file  Tobacco Use  . Smoking status: Current Every Day Smoker    Packs/day: 0.50    Years: 3.00    Pack years: 1.50    Types: Cigarettes  . Smokeless tobacco: Never Used  Substance and Sexual Activity  . Alcohol use: Yes    Alcohol/week: 0.0 standard drinks    Comment: OCC-ONCE WEEKLY  . Drug use: Yes    Types: Marijuana  Comment: VERY RARE  . Sexual activity: Not Currently  Lifestyle  . Physical activity:    Days per week: Not on file    Minutes per session: Not on file  . Stress: Not on file  Relationships  . Social connections:    Talks on phone: Not on file    Gets together: Not on file    Attends religious service: Not on file    Active member of club or organization: Not on file    Attends meetings of clubs or organizations: Not on file    Relationship status: Not on file  Other Topics Concern  . Not on file  Social History Narrative  . Not on file   Additional Social History:    Pain Medications: see MAR Prescriptions: see MAR Over the Counter: see MAR History of alcohol /  drug use?: Yes Longest period of sobriety (when/how long): patient has no history of problematic use and states that she has not been drinking or using ion a reguler basis Name of Substance 1: alcohol  1 - Age of First Use: 14 1 - Amount (size/oz): 4 mixed drinks 1 - Frequency: 1 weekend a month 1 - Duration: since onset 1 - Last Use / Amount: last use was [place] Name of Substance 2: cannabis 2 - Age of First Use: unknown 2 - Amount (size/oz): undetermined amount 2 - Frequency: occasionally 2 - Duration: since onset(since onset) 2 - Last Use / Amount: undetermine amount                Sleep: Good  Appetite:  Good  Current Medications: Current Facility-Administered Medications  Medication Dose Route Frequency Provider Last Rate Last Dose  . alum & mag hydroxide-simeth (MAALOX/MYLANTA) 200-200-20 MG/5ML suspension 30 mL  30 mL Oral Q4H PRN Oneta Rack, NP      . escitalopram (LEXAPRO) tablet 10 mg  10 mg Oral Daily Cobos, Rockey Situ, MD   10 mg at 05/17/18 0955  . LORazepam (ATIVAN) tablet 0.5 mg  0.5 mg Oral Q6H PRN Cobos, Rockey Situ, MD   0.5 mg at 05/16/18 2234  . magnesium hydroxide (MILK OF MAGNESIA) suspension 30 mL  30 mL Oral Daily PRN Oneta Rack, NP        Lab Results: No results found for this or any previous visit (from the past 48 hour(s)).  Blood Alcohol level:  No results found for: [facility]  Metabolic Disorder Labs: No results found for: HGBA1C, MPG No results found for: PROLACTIN Lab Results  Component Value Date   CHOL 147 05/13/2018   TRIG 135 05/13/2018   HDL 33 (L) 05/13/2018   CHOLHDL 4.5 05/13/2018   VLDL 27 05/13/2018   LDLCALC 87 05/13/2018    Physical Findings: AIMS: Facial and Oral Movements Muscles of Facial Expression: None, normal Lips and Perioral Area: None, normal Jaw: None, normal Tongue: None, normal,Extremity Movements Upper (arms, wrists, hands, fingers): None, normal Lower (legs, knees, ankles, toes): None, normal,  Trunk Movements Neck, shoulders, hips: None, normal, Overall Severity Severity of abnormal movements (highest score from questions above): None, normal Incapacitation due to abnormal movements: None, normal Patient's awareness of abnormal movements (rate only patient's report): No Awareness, Dental Status Current problems with teeth and/or dentures?: No Does patient usually wear dentures?: No  CIWA:  CIWA-Ar Total: 1 COWS:  COWS Total Score: 1  Musculoskeletal: Strength & Muscle Tone: within normal limits Gait & Station: normal Patient leans: N/A  Psychiatric Specialty Exam: Physical Exam  Nursing note and vitals reviewed. Constitutional: She is oriented to person, place, and time. She appears well-developed and well-nourished.  HENT:  Head: Normocephalic and atraumatic.  Respiratory: Effort normal.  Neurological: She is alert and oriented to person, place, and time.    ROS  Blood pressure 129/86, pulse 99, temperature 98.2 F (36.8 C), temperature source Oral, resp. rate 20, height 5\' 6"  (1.676 m), weight 117.9 kg, SpO2 93 %.Body mass index is 41.97 kg/m.  General Appearance: Casual  Eye Contact:  Fair  Speech:  Pressured  Volume:  Normal  Mood:  Anxious  Affect:  Congruent  Thought Process:  Coherent and Descriptions of Associations: Intact  Orientation:  Full (Time, Place, and Person)  Thought Content:  Logical  Suicidal Thoughts:  No  Homicidal Thoughts:  No  Memory:  Immediate;   Fair Recent;   Fair Remote;   Fair  Judgement:  Intact  Insight:  Fair  Psychomotor Activity:  Increased  Concentration:  Concentration: Fair and Attention Span: Fair  Recall:  [employer]Fair  Fund of Knowledge:  Fair  Language:  Fair  Akathisia:  Negative  Handed:  Right  AIMS (if indicated):     Assets:  Communication Skills Desire for Improvement Housing Physical Health Resilience Social Support  ADL's:  Intact  Cognition:  WNL  Sleep:  Number of Hours: 6.25     Treatment Plan  Summary: Daily contact with patient to assess and evaluate symptoms and progress in treatment, Medication management and Plan : Patient is seen and examined.  Patient is a 27 year old female with the above-stated past psychiatric history was seen in follow-up.  #1-major depression/generalized anxiety/obsessive-compulsive disorder-patient stated that her mood and anxiety are improving on Lexapro 10 mg p.o. daily as well as as needed and lorazepam.  She stated she is not having as many intrusive thoughts, her mood is improving, and her anxiety is decreasing.  No change in these medications today.  #2 discharge planning-estimated length of stay is an additional 2 to 3 days from today.  Antonieta PertGreg Lawson Candy Ziegler, MD 05/17/2018, 11:35 AM

## 2018-05-17 NOTE — [facility] Group Notes (Signed)
[facility] Group Notes:  (Nursing) Date:  05/17/2018  Time: 130 PM Type of Therapy:  Nurse Education  Participation Level:  Active  Participation Quality:  Appropriate  Affect:  Appropriate  Cognitive:  Appropriate  Insight:  Appropriate  Engagement in Group:  Engaged  Modes of Intervention:  Discussion and Education  Summary of Progress/Problems:  Nurse led group played a non competitive learning/communication board game that fosters listening skills as well as self expression.   Shela NevinValerie S Domanique Huesman 05/17/2018, 3:19 PM

## 2018-05-17 NOTE — [facility] Group Notes (Signed)
LCSW Group Therapy Note  05/17/2018   10:00-11:00am   Type of Therapy and Topic:  Group Therapy: Anger Cues and Responses  Participation Level:  Active   Description of Group:   In this group, patients learned how to recognize the physical, cognitive, emotional, and behavioral responses they have to anger-provoking situations.  They identified a recent time they became angry and how they reacted.  They analyzed how their reaction was possibly beneficial and how it was possibly unhelpful.  The group discussed a variety of healthier coping skills that could help with such a situation in the future.  Deep breathing was practiced briefly.  Therapeutic Goals: 1. Patients will remember their last incident of anger and how they felt emotionally and physically, what their thoughts were at the time, and how they behaved. 2. Patients will identify how their behavior at that time worked for them, as well as how it worked against them. 3. Patients will explore possible new behaviors to use in future anger situations. 4. Patients will learn that anger itself is normal and cannot be eliminated, and that healthier reactions can assist with resolving conflict rather than worsening situations.  Summary of Patient Progress:  The patient shared that her most recent time of anger was last night and said her anger was caused by a staff member being rude and belittling to the entire group.  Her chosen coping skill was to talk about the staff member with other group members.  Therapeutic Modalities:   Cognitive Behavioral Therapy  Lynnell ChadMareida J Grossman-Orr

## 2018-05-17 NOTE — Progress Notes (Signed)
D: Briana HolmesMadelyn has been calm and cooperative today. She was fatigued this a.m but otherwise indicated no concerns, complaints, or questions. She denied SI, HI, and AVH. She has participated in groups. On her self inventory, she reported poor sleep, good appetite, normal energy level, and good concentration. She rated her depression 3/10 (ten being worst), hopelessness 1/10, and anxiety 4/10. Her goal was to "continue to work on my stressors and preparing for them."  A: Meds given as ordered. No PRNs requested or given. Q15 safety checks maintained. Support/encouragement offered.  R: Pt remains free from harm and continues with treatment. Will continue to monitor for needs/safety.

## 2018-05-17 NOTE — Progress Notes (Signed)
Adult Psychoeducational Group Note  Date:  05/17/2018 Time:  10:18 PM  Group Topic/Focus:  Wrap-Up Group:   The focus of this group is to help patients review their daily goal of treatment and discuss progress on daily workbooks.  Participation Level:  Active  Participation Quality:  Appropriate  Affect:  Appropriate  Cognitive:  Appropriate  Insight: Appropriate  Engagement in Group:  Engaged  Modes of Intervention:  Discussion  Additional Comments:  Pt stated her goal for today was to discover her triggers and some coping skills to use to deal with her triggers once she becomes upset. Pt stated she accomplished her goal today. Pt rated her over all day and 8 out of 10. Pt stated she attend all groups held today. Pt stated the angry management group help her improve her day.  Briana FurnaceChristopher  [name] 05/17/2018, 10:18 PM

## 2018-05-18 MED ORDER — INFLUENZA VAC SPLIT QUAD 0.5 ML IM SUSY
0.5000 mL | PREFILLED_SYRINGE | INTRAMUSCULAR | Status: AC
  Administered 2018-05-19: 0.5 mL via INTRAMUSCULAR
  Filled 2018-05-18: qty 0.5

## 2018-05-18 MED ORDER — HYDROXYZINE HCL 25 MG PO TABS
25.0000 mg | ORAL_TABLET | ORAL | Status: DC | PRN
  Administered 2018-05-18 – 2018-05-19 (×2): 25 mg via ORAL
  Filled 2018-05-18 (×2): qty 1

## 2018-05-18 NOTE — [facility] Group Notes (Signed)
Adult Psychoeducational Group Note  Date:  05/18/2018 Time:  1315  Group Topic/Focus: Love Languages Healthy Communication:   The focus of this group is to discuss communication, barriers to communication, as well as healthy ways to communicate with others.  Participation Level:  Active  Participation Quality:  Attentive and Sharing  Affect:  Appropriate  Cognitive:  Appropriate  Insight: Appropriate  Engagement in Group:  Developing/Improving and Engaged  Modes of Intervention:  Discussion, Education, Exploration and Socialization  Additional Comments:  Patient commented that her love language is a tie between quality time and words of affirmation.   Jonathon BellowsJennifer M Sherard Sutch 05/18/2018, 2:49 PM

## 2018-05-18 NOTE — Plan of Care (Signed)
  Problem: Education: Goal: Emotional status will improve Outcome: Progressing Patient endorses feeling improvement in mood each day. Verbalizes belief that meds are working.   Problem: Activity: Goal: Interest or engagement in activities will improve Outcome: Progressing Patient is visible in the milieu, interactive with peers and attending groups.

## 2018-05-18 NOTE — Progress Notes (Signed)
D: Patient observed sitting in the dayroom this evening, interacting with peers and staff appropriately. Patient states today has been "an okay day." "I have family stuff to deal with but they didn't visit tonight which is probably a good thing. I am feeling better each day and definitely better from since I first came in."  Patient's affect flat, mood anxious, depressed but pleasant. Denies pain, physical complaints.   A: Medicated per orders, prn ativan given for anxiety. Medication education provided. Level III obs in place for safety. Emotional support offered. Patient encouraged to complete Suicide Safety Plan before discharge. Encouraged to attend and participate in unit programming.     R: Patient verbalizes understanding of POC. On reassess, patient reports feeling calmer. Patient denies SI/HI/AVH and remains safe on level III obs. Will continue to monitor throughout the night.

## 2018-05-18 NOTE — Plan of Care (Signed)
D: Patient presents depressed, guarded. She reports feeling "okay" and sleeping well last night. She denied pain or physical symptoms. Patient denies SI/HI/AVH. She has had no further command AH to harm loved ones. Patient refused to fill out self-inventory. A: Patient checked q15 min, and checks reviewed. Reviewed medication changes with patient and educated on side effects. Educated patient on importance of attending group therapy sessions and educated on several coping skills. Encouarged participation in milieu through recreation therapy and attending meals with peers. Support and encouragement provided. Fluids offered. R: Patient receptive to education on medications, and is medication compliant. Patient contracts for safety on the unit.

## 2018-05-18 NOTE — Progress Notes (Signed)
D: Patient appears brighter today.  She is observed in the day room interacting with her peers.  She has been attending groups and participating in her treatment.  She denies any thoughts of self harm.  Patient is a possible discharge in the next 1-2 days.  She continues to rate her anxiety high, however, she appears to be in no distress, physically or mentally.  A: Continue to monitor medication management and MD orders.  Safety checks completed every 15 minutes per protocol.  Offer support and encouragement as needed.  R: Patient is receptive to staff; her behavior is appropriate.

## 2018-05-18 NOTE — Plan of Care (Signed)
  Problem: Education: Goal: Emotional status will improve Outcome: Progressing Goal: Mental status will improve Outcome: Progressing   Problem: Activity: Goal: Interest or engagement in activities will improve Outcome: Progressing Goal: Sleeping patterns will improve Outcome: Progressing   

## 2018-05-18 NOTE — [facility] Group Notes (Signed)
[facility] LCSW Group Therapy Note  05/18/2018  9:00-10:00AM  Type of Therapy and Topic:  Group Therapy:  Being Your Own Support  Participation Level:  Active   Description of Group:  Patients in this group were introduced to the concept that self-support is an essential part of recovery.  A song entitled "My Own Hero" was played and a group discussion ensued in which patients stated they could relate to the song and it inspired them to realize they have be willing to help themselves in order to succeed, because other people cannot achieve sobriety or stability for them.  We discussed adding a variety of healthy supports to address the various needs in their lives.  A song was played called "I Know Where I've Been" toward the end of group and used to conduct an inspirational wrap-up to group of remembering how far they have already come in their journey.  Therapeutic Goals: 1)  demonstrate the importance of being a part of one's own support system 2)  discuss reasons people in one's life may eventually be unable to be continually supportive  3)  identify the patient's current support system and   4)  elicit commitments to add healthy supports and to become more conscious of being self-supportive   Summary of Patient Progress:  The patient expressed that her friends and therapist are healthy supports while her family is unhealthy.  She participated in the group well.   Therapeutic Modalities:   Motivational Interviewing Activity  Lynnell ChadMareida J Grossman-Orr

## 2018-05-18 NOTE — Progress Notes (Signed)
[facility] MD Progress Note  05/18/2018 11:26 AM Briana Sexton  MRN:  [id number] Subjective: Patient is seen and examined.  Patient is a 27 year old female with a reported past psychiatric history significant for major depression, generalized anxiety disorder, and some intrusive thoughts/obsessive-compulsive symptomatology.  She is seen in follow-up.  She stated she felt like she was about an 8 out of 10 today.  10 out of 10 being normal.  She is tolerating the Lexapro without difficulty.  She is trying to work on her coping skills so she can deal with the stress at home as well as work.  She also stated that she needs better coping skills to deal with her sister and her mother.  She had some mild increase in stress after her family did not visit last night.  Her vital signs are stable.  She is afebrile.  The electronic medical record stated she slept 6.75 hours last night. Principal Problem: <principal problem not specified> Diagnosis:   Patient Active Problem List   Diagnosis Date Noted  . MDD (major depressive disorder) [F32.9] 05/12/2018  . Tobacco use [Z72.0] 10/09/2017  . Morbid obesity (HCC) [E66.01] 10/09/2017  . Calculus of gallbladder without cholecystitis without obstruction [K80.20]    Total Time spent with patient: 20 minutes  Past Psychiatric History: See admission H&P  Past Medical History:  Past Medical History:  Diagnosis Date  . Abnormal uterine bleeding   . Depression   . GERD (gastroesophageal reflux disease)   . Headache    MIGRAINES  . Pre-diabetes     Past Surgical History:  Procedure Laterality Date  . CHOLECYSTECTOMY N/A 01/30/2017   Procedure: LAPAROSCOPIC CHOLECYSTECTOMY;  Surgeon: Ancil Linsey, MD;  Location: [facility] ORS;  Service: General;  Laterality: N/A;  . NO PAST SURGERIES     Family History:  Family History  Problem Relation Age of Onset  . Hypertension Sister   . Diabetes Maternal Uncle   . Thyroid disease Mother    Family Psychiatric  History: See  admission H&P Social History:  Social History   Substance and Sexual Activity  Alcohol Use Yes  . Alcohol/week: 0.0 standard drinks   Comment: OCC-ONCE WEEKLY     Social History   Substance and Sexual Activity  Drug Use Yes  . Types: Marijuana   Comment: VERY RARE    Social History   Socioeconomic History  . Marital status: Single    Spouse name: Not on file  . Number of children: Not on file  . Years of education: Not on file  . Highest education level: Not on file  Occupational History  . Not on file  Social Needs  . Financial resource strain: Not on file  . Food insecurity:    Worry: Not on file    Inability: Not on file  . Transportation needs:    Medical: Not on file    Non-medical: Not on file  Tobacco Use  . Smoking status: Current Every Day Smoker    Packs/day: 0.50    Years: 3.00    Pack years: 1.50    Types: Cigarettes  . Smokeless tobacco: Never Used  Substance and Sexual Activity  . Alcohol use: Yes    Alcohol/week: 0.0 standard drinks    Comment: OCC-ONCE WEEKLY  . Drug use: Yes    Types: Marijuana    Comment: VERY RARE  . Sexual activity: Not Currently  Lifestyle  . Physical activity:    Days per week: Not on file  Minutes per session: Not on file  . Stress: Not on file  Relationships  . Social connections:    Talks on phone: Not on file    Gets together: Not on file    Attends religious service: Not on file    Active member of club or organization: Not on file    Attends meetings of clubs or organizations: Not on file    Relationship status: Not on file  Other Topics Concern  . Not on file  Social History Narrative  . Not on file   Additional Social History:    Pain Medications: see MAR Prescriptions: see MAR Over the Counter: see MAR History of alcohol / drug use?: Yes Longest period of sobriety (when/how long): patient has no history of problematic use and states that she has not been drinking or using ion a reguler  basis Name of Substance 1: alcohol  1 - Age of First Use: 14 1 - Amount (size/oz): 4 mixed drinks 1 - Frequency: 1 weekend a month 1 - Duration: since onset 1 - Last Use / Amount: last use was [place] Name of Substance 2: cannabis 2 - Age of First Use: unknown 2 - Amount (size/oz): undetermined amount 2 - Frequency: occasionally 2 - Duration: since onset(since onset) 2 - Last Use / Amount: undetermine amount                Sleep: Fair  Appetite:  Good  Current Medications: Current Facility-Administered Medications  Medication Dose Route Frequency Provider Last Rate Last Dose  . alum & mag hydroxide-simeth (MAALOX/MYLANTA) 200-200-20 MG/5ML suspension 30 mL  30 mL Oral Q4H PRN Oneta Rack, NP      . escitalopram (LEXAPRO) tablet 10 mg  10 mg Oral Daily Cobos, Rockey Situ, MD   10 mg at 05/18/18 0820  . LORazepam (ATIVAN) tablet 0.5 mg  0.5 mg Oral Q6H PRN Cobos, Rockey Situ, MD   0.5 mg at 05/17/18 2211  . magnesium hydroxide (MILK OF MAGNESIA) suspension 30 mL  30 mL Oral Daily PRN Oneta Rack, NP        Lab Results: No results found for this or any previous visit (from the past 48 hour(s)).  Blood Alcohol level:  No results found for: [facility]  Metabolic Disorder Labs: No results found for: HGBA1C, MPG No results found for: PROLACTIN Lab Results  Component Value Date   CHOL 147 05/13/2018   TRIG 135 05/13/2018   HDL 33 (L) 05/13/2018   CHOLHDL 4.5 05/13/2018   VLDL 27 05/13/2018   LDLCALC 87 05/13/2018    Physical Findings: AIMS: Facial and Oral Movements Muscles of Facial Expression: None, normal Lips and Perioral Area: None, normal Jaw: None, normal Tongue: None, normal,Extremity Movements Upper (arms, wrists, hands, fingers): None, normal Lower (legs, knees, ankles, toes): None, normal, Trunk Movements Neck, shoulders, hips: None, normal, Overall Severity Severity of abnormal movements (highest score from questions above): None, normal Incapacitation  due to abnormal movements: None, normal Patient's awareness of abnormal movements (rate only patient's report): No Awareness, Dental Status Current problems with teeth and/or dentures?: No Does patient usually wear dentures?: No  CIWA:  CIWA-Ar Total: 1 COWS:  COWS Total Score: 1  Musculoskeletal: Strength & Muscle Tone: within normal limits Gait & Station: normal Patient leans: N/A  Psychiatric Specialty Exam: Physical Exam  Nursing note and vitals reviewed. Constitutional: She is oriented to person, place, and time. She appears well-developed and well-nourished.  HENT:  Head: Normocephalic and atraumatic.  Respiratory: Effort normal.  Neurological: She is alert and oriented to person, place, and time.    ROS  Blood pressure 115/72, pulse 83, temperature 98 F (36.7 C), temperature source Oral, resp. rate 20, height 5\' 6"  (1.676 m), weight 117.9 kg, SpO2 93 %.Body mass index is 41.97 kg/m.  General Appearance: Casual  Eye Contact:  Good  Speech:  Normal Rate  Volume:  Normal  Mood:  Anxious  Affect:  Congruent  Thought Process:  Coherent and Descriptions of Associations: Intact  Orientation:  Full (Time, Place, and Person)  Thought Content:  Logical  Suicidal Thoughts:  No  Homicidal Thoughts:  No  Memory:  Immediate;   Fair Recent;   Fair Remote;   Fair  Judgement:  Intact  Insight:  Fair  Psychomotor Activity:  Increased  Concentration:  Concentration: Fair and Attention Span: Fair  Recall:  [employer]Fair  Fund of Knowledge:  Fair  Language:  Good  Akathisia:  Negative  Handed:  Right  AIMS (if indicated):     Assets:  Communication Skills Desire for Improvement Financial Resources/Insurance Housing Physical Health Resilience Social Support Talents/Skills  ADL's:  Intact  Cognition:  WNL  Sleep:  Number of Hours: 6.75     Treatment Plan Summary: Daily contact with patient to assess and evaluate symptoms and progress in treatment, Medication management and  Plan : Patient is seen and examined.  Patient is a 27 year old female with the above-stated past psychiatric history who is seen in follow-up.  #1-major depression/generalized anxiety/obsessive-compulsive symptomatology-patient rated her mood and anxiety at approximately an 8 out of 10 today were 10 out of 10 is normal.  We will continue the Lexapro 10 mg p.o. daily for mood and anxiety.  She will continue the lorazepam 0.5 mg p.o. as needed anxiety.  No other changes in her medications.  #2 discharge planning-estimated length of stay is an additional 1 to 2 days from today.  Antonieta PertGreg Lawson Mishon Blubaugh, MD 05/18/2018, 11:26 AM

## 2018-05-18 NOTE — [facility] Group Notes (Signed)
Adult Psychoeducational Group Note  Date:  05/18/2018 Time:  8:58 AM  Group Topic/Focus:  Goals Group:   The focus of this group is to help patients establish daily goals to achieve during treatment and discuss how the patient can incorporate goal setting into their daily lives to aide in recovery.  Participation Level:  Active  Participation Quality:  Appropriate  Affect:  Appropriate  Cognitive:  Alert  Insight: Appropriate  Engagement in Group:  Engaged  Modes of Intervention:  Orientation  Additional Comments:  Pt participated in goals/psycho-ed group session.  Dellia NimsJaquesha M Wardell Pokorski 05/18/2018, 8:58 AM

## 2018-05-19 MED ORDER — ESCITALOPRAM OXALATE 10 MG PO TABS: 10.0000 mg | ORAL_TABLET | Freq: Every day | ORAL | 0 refills | Status: DC

## 2018-05-19 NOTE — Progress Notes (Signed)
Patient ID: Maple MirzaMadelyn Ide, female   DOB: [birth year], 27 y.o.   MRN: [id number]  Discharge Note  D) Patient discharged to lobby. Patient states readiness for discharge. Patient denies SI/HI, AVH and is not delusional or psychotic. Patient in no acute distress. Patient has completed their Suicide Safety Plan. Patient provided an opportunity to complete and return Patient Satisfaction Survey.   A) Written and verbal discharge instructions given to the patient. Patient accepting to information and verbalized understanding. Patient agrees to the discharge plan. Opportunity for questions and concerns presented to patient. Patient denied any further questions or concerns. All belongings returned to patient. Patient signed for return of belongings and discharge paperwork. Patient provided a copy of their Suicide Safety Plan and has been provided a copy of the Patient Satisfaction Survey with return instructions.  R) Patient safely escorted to the lobby. [facility], personal belongings, follow-up appointment in place and discharge paperwork.

## 2018-05-19 NOTE — [facility] Group Notes (Signed)
[facility] LCSW Group Therapy Note  Date/Time: 05/19/18, 1315  Type of Therapy and Topic:  Group Therapy:  Overcoming Obstacles  Participation Level:  active  Description of Group:    In this group patients will be encouraged to explore what they see as obstacles to their own wellness and recovery. They will be guided to discuss their thoughts, feelings, and behaviors related to these obstacles. The group will process together ways to cope with barriers, with attention given to specific choices patients can make. Each patient will be challenged to identify changes they are motivated to make in order to overcome their obstacles. This group will be process-oriented, with patients participating in exploration of their own experiences as well as giving and receiving support and challenge from other group members.  Therapeutic Goals: 1. Patient will identify personal and current obstacles as they relate to admission. 2. Patient will identify barriers that currently interfere with their wellness or overcoming obstacles.  3. Patient will identify feelings, thought process and behaviors related to these barriers. 4. Patient will identify two changes they are willing to make to overcome these obstacles:    Summary of Patient Progress; Pt shared that work, family, and finances are current obstacles.  Pt active in group discussion regarding positive ways to work towards overcoming obstacles.      Therapeutic Modalities:   Cognitive Behavioral Therapy Solution Focused Therapy Motivational Interviewing Relapse Prevention Therapy  Daleen SquibbGreg Cortny Bambach, LCSW

## 2018-05-19 NOTE — Progress Notes (Signed)
Patient ID: Briana Sexton, female   DOB: 13-Mar-1991, 27 y.o.   MRN: [id number]  Nursing Progress Note [id number]  Data: Patient presents pleasant, calm and cooperative. Patient complaint with scheduled medications. Patient denies pain/physical complaints. Patient completed self-inventory sheet and rates depression, hopelessness, and anxiety 1,0,3 respectively. Patient rates their sleep and appetite as fair/good respectively. Patient states goal for today is to "work on discharge plan". Patient is seen attending groups and visible in the milieu. Patient currently denies SI/HI/AVH.   Action: Patient is educated about and provided medication per provider's orders. Patient safety maintained with q15 min safety checks and frequent rounding. Low fall risk precautions in place. Emotional support given. 1:1 interaction and active listening provided. Patient encouraged to attend meals, groups, and work on treatment plan and goals. Labs, vital signs and patient behavior monitored throughout shift.   Response: Patient remains safe on the unit at this time and agrees to come to staff with any issues/concerns. Patient is interacting with peers appropriately on the unit. Will continue to support and monitor.

## 2018-05-19 NOTE — Tx Team (Addendum)
Interdisciplinary Treatment and Diagnostic Plan Update  05/19/2018 Time of Session: 9:30am Briana Sexton MRN: [id number]  Principal Diagnosis: <principal problem not specified>  Secondary Diagnoses: Active Problems:   MDD (major depressive disorder)   Current Medications:  Current Facility-Administered Medications  Medication Dose Route Frequency Provider Last Rate Last Dose  . alum & mag hydroxide-simeth (MAALOX/MYLANTA) 200-200-20 MG/5ML suspension 30 mL  30 mL Oral Q4H PRN Oneta RackLewis, Tanika N, NP      . escitalopram (LEXAPRO) tablet 10 mg  10 mg Oral Daily Cobos, Rockey SituFernando A, MD   10 mg at 05/19/18 0817  . hydrOXYzine (ATARAX/VISTARIL) tablet 25 mg  25 mg Oral Q4H PRN Antonieta Pertlary, Greg Lawson, MD   25 mg at 05/18/18 2129  . Influenza vac split quadrivalent PF (FLUARIX) injection 0.5 mL  0.5 mL Intramuscular Tomorrow-1000 Jola Babinskilary, Marlane MingleGreg Lawson, MD      . magnesium hydroxide (MILK OF MAGNESIA) suspension 30 mL  30 mL Oral Daily PRN Oneta RackLewis, Tanika N, NP       PTA Medications: Medications Prior to Admission  Medication Sig Dispense Refill Last Dose  . escitalopram (LEXAPRO) 10 MG tablet Take 10 mg by mouth daily.   unknown  . etonogestrel (NEXPLANON) 68 MG IMPL implant 1 each by Subdermal route once.   unknown  . cetirizine (ZYRTEC) 10 MG tablet Take 1 tablet (10 mg total) by mouth daily. (Patient not taking: Reported on 10/09/2017) 15 tablet 0 Not Taking at Unknown time  . ibuprofen (ADVIL,MOTRIN) 600 MG tablet Take 1 tablet by mouth three times daily with meals (Patient not taking: Reported on 10/09/2017) 15 tablet 0 Not Taking at Unknown time  . metoCLOPramide (REGLAN) 10 MG tablet Take 1 tablet (10 mg total) by mouth every 8 (eight) hours as needed for nausea. 20 tablet 0   . metroNIDAZOLE (FLAGYL) 500 MG tablet Take 1 tablet (500 mg total) by mouth 2 (two) times daily. (Patient not taking: Reported on 05/13/2018) 14 tablet 0 Completed Course at Unknown time  . nitrofurantoin, macrocrystal-monohydrate,  (MACROBID) 100 MG capsule Take 1 capsule (100 mg total) by mouth 2 (two) times daily. (Patient not taking: Reported on 10/09/2017) 14 capsule 0 Completed Course at Unknown time  . norgestimate-ethinyl estradiol (ORTHO-CYCLEN,SPRINTEC,PREVIFEM) 0.25-35 MG-MCG tablet Take 1 tablet by mouth daily. (Patient not taking: Reported on 10/09/2017) 3 Package 4 Not Taking    Patient Stressors: Marital or family conflict Medication change or noncompliance Occupational concerns  Patient Strengths: Ability for insight Average or above average intelligence Capable of independent living General fund of knowledge Supportive family/friends  Treatment Modalities: Medication Management, Group therapy, Case management,  1 to 1 session with clinician, Psychoeducation, Recreational therapy.   Physician Treatment Plan for Primary Diagnosis: <principal problem not specified> Long Term Goal(s): Improvement in symptoms so as ready for discharge Improvement in symptoms so as ready for discharge   Short Term Goals: Ability to identify changes in lifestyle to reduce recurrence of condition will improve Ability to maintain clinical measurements within normal limits will improve Ability to identify changes in lifestyle to reduce recurrence of condition will improve Ability to verbalize feelings will improve Ability to disclose and discuss suicidal ideas Ability to demonstrate self-control will improve Ability to identify and develop effective coping behaviors will improve  Medication Management: Evaluate patient's response, side effects, and tolerance of medication regimen.  Therapeutic Interventions: 1 to 1 sessions, Unit Group sessions and Medication administration.  Evaluation of Outcomes: Adequate for Discharge  Physician Treatment Plan for Secondary Diagnosis: Active Problems:  MDD (major depressive disorder)  Long Term Goal(s): Improvement in symptoms so as ready for discharge Improvement in symptoms so  as ready for discharge   Short Term Goals: Ability to identify changes in lifestyle to reduce recurrence of condition will improve Ability to maintain clinical measurements within normal limits will improve Ability to identify changes in lifestyle to reduce recurrence of condition will improve Ability to verbalize feelings will improve Ability to disclose and discuss suicidal ideas Ability to demonstrate self-control will improve Ability to identify and develop effective coping behaviors will improve     Medication Management: Evaluate patient's response, side effects, and tolerance of medication regimen.  Therapeutic Interventions: 1 to 1 sessions, Unit Group sessions and Medication administration.  Evaluation of Outcomes: Adequate for Discharge   RN Treatment Plan for Primary Diagnosis: <principal problem not specified> Long Term Goal(s): Knowledge of disease and therapeutic regimen to maintain health will improve  Short Term Goals: Ability to verbalize feelings will improve, Ability to disclose and discuss suicidal ideas, Ability to identify and develop effective coping behaviors will improve and Compliance with prescribed medications will improve  Medication Management: RN will administer medications as ordered by provider, will assess and evaluate patient's response and provide education to patient for prescribed medication. RN will report any adverse and/or side effects to prescribing provider.  Therapeutic Interventions: 1 on 1 counseling sessions, Psychoeducation, Medication administration, Evaluate responses to treatment, Monitor vital signs and CBGs as ordered, Perform/monitor CIWA, COWS, AIMS and Fall Risk screenings as ordered, Perform wound care treatments as ordered.  Evaluation of Outcomes: Adequate for Discharge   LCSW Treatment Plan for Primary Diagnosis: <principal problem not specified> Long Term Goal(s): Safe transition to appropriate next level of care at discharge,  Engage patient in therapeutic group addressing interpersonal concerns.  Short Term Goals: Engage patient in aftercare planning with referrals and resources  Therapeutic Interventions: Assess for all discharge needs, 1 to 1 time with Social worker, Explore available resources and support systems, Assess for adequacy in community support network, Educate family and significant other(s) on suicide prevention, Complete Psychosocial Assessment, Interpersonal group therapy.  Evaluation of Outcomes: Adequate for Discharge   Progress in Treatment: Attending groups: Yes. Participating in groups: Yes. Taking medication as prescribed: Yes. Toleration medication: Yes. Family/Significant other contact made: Yes, individual(s) contacted:  the patient's friend Patient understands diagnosis: Yes. Discussing patient identified problems/goals with staff: Yes. Medical problems stabilized or resolved: Yes. Denies suicidal/homicidal ideation: Yes. Issues/concerns per patient self-inventory: No. Other:  New problem(s) identified: None   New Short Term/Long Term Goal(s):medication stabilization, elimination of SI thoughts, development of comprehensive mental wellness plan.   Patient Goals:  Help with work and family issues   Discharge Plan or Barriers: Patient plans to discharge home and follow up with [facility] for outpatient medication management and therapy services.   Reason for Continuation of Hospitalization: None  Estimated Length of Stay: Discharge, 05/19/18  Attendees: Patient: Briana Sexton  05/19/2018 8:33 AM  Physician: Dr. Landry Mellow, MD 05/19/2018 8:33 AM  Nursing: Meriam Sprague.K, RN; Chancy Hurter, RN 05/19/2018 8:33 AM  RN Care Manager: Juliann Pares 05/19/2018 8:33 AM  Social Worker: Alcario Drought 05/19/2018 8:33 AM  Recreational Therapist: Juliann Pares 05/19/2018 8:33 AM  Other: X 05/19/2018 8:33 AM  Other: X 05/19/2018 8:33 AM  Other:X 05/19/2018 8:33 AM    Scribe for Treatment  Team: Maeola Sarah, LCSWA 05/19/2018 8:33 AM

## 2018-05-19 NOTE — Discharge Summary (Addendum)
Physician Discharge Summary Note  Patient:  Briana Sexton is an 27 y.o., female MRN:  [id number] DOB:  March 01, 1991 Patient phone:  [phone] (home)  Patient address:   [address] [place] [address],  Total Time spent with patient: 20 minutes  Date of Admission:  05/12/2018 Date of Discharge:  05/19/2018  Reason for Admission: Depression   Per assessment note:27 year old single female, presented to hospital voluntarily . Reports she has had violent, homicidal ideations towards her mother recently, with thoughts of stabbing her. States that these thoughts have been occurring x 2-3 weeks. States she is concerned about these thoughts and denies any prior  history of violence . She describes an element of obsessiveness recently, and describes above thoughts as obsessive, ego-dystonic, and without intent . States she has also been engaging in increased checking behaviors recently, as an example  checking and rechecking doors and stove to make sure it is off several times and taking a photo of stove prior to going to bed in order to confirm it is really off. She states that there has been some increased psychosocial tension and stress , feels that too much of the household responsibilities fall on her . States she feels her mother " is passive ". Patient states she has to make home decisions /deeal with home finances and assist mother, such as driving her to and from her appointments " even though I am so busy with my own life". She also endorses recent depression and endorses some neuro-vegetative symptoms of depression as below. States she has been feeling depressed intermittently , but has been feeling more depressed over recent days .  Principal Problem: MDD (major depressive disorder) Discharge Diagnoses: Patient Active Problem List   Diagnosis Date Noted  . MDD (major depressive disorder) [F32.9] 05/12/2018  . Tobacco use [Z72.0] 10/09/2017  . Morbid obesity (HCC) [E66.01] 10/09/2017  .  Calculus of gallbladder without cholecystitis without obstruction [K80.20]     Past Psychiatric History: no prior psychiatric admissions , no history of suicide attempts, history of one isolated episode of self cutting at age 43. No history of mania, no history of PTSD, reports recently worsening anxiety and panic attacks . Endorses some agoraphobia. Denies history of violence . States she had consulted as outpatient 2 years ago for depression, at which time she was prescribed Lexapro , but did not take it. She states she just recently started Lexapro 2-3 days ago  Past Medical History:  Past Medical History:  Diagnosis Date  . Abnormal uterine bleeding   . Depression   . GERD (gastroesophageal reflux disease)   . Headache    MIGRAINES  . Pre-diabetes     Past Surgical History:  Procedure Laterality Date  . CHOLECYSTECTOMY N/A 01/30/2017   Procedure: LAPAROSCOPIC CHOLECYSTECTOMY;  Surgeon: Ancil Linsey, MD;  Location: [facility] ORS;  Service: General;  Laterality: N/A;  . NO PAST SURGERIES     Family History:  Family History  Problem Relation Age of Onset  . Hypertension Sister   . Diabetes Maternal Uncle   . Thyroid disease Mother    Family Psychiatric  History:  Social History:  Social History   Substance and Sexual Activity  Alcohol Use Yes  . Alcohol/week: 0.0 standard drinks   Comment: OCC-ONCE WEEKLY     Social History   Substance and Sexual Activity  Drug Use Yes  . Types: Marijuana   Comment: VERY RARE    Social History   Socioeconomic History  .  Marital status: Single    Spouse name: Not on file  . Number of children: Not on file  . Years of education: Not on file  . Highest education level: Not on file  Occupational History  . Not on file  Social Needs  . Financial resource strain: Not on file  . Food insecurity:    Worry: Not on file    Inability: Not on file  . Transportation needs:    Medical: Not on file    Non-medical: Not on file   Tobacco Use  . Smoking status: Current Every Day Smoker    Packs/day: 0.50    Years: 3.00    Pack years: 1.50    Types: Cigarettes  . Smokeless tobacco: Never Used  Substance and Sexual Activity  . Alcohol use: Yes    Alcohol/week: 0.0 standard drinks    Comment: OCC-ONCE WEEKLY  . Drug use: Yes    Types: Marijuana    Comment: VERY RARE  . Sexual activity: Not Currently  Lifestyle  . Physical activity:    Days per week: Not on file    Minutes per session: Not on file  . Stress: Not on file  Relationships  . Social connections:    Talks on phone: Not on file    Gets together: Not on file    Attends religious service: Not on file    Active member of club or organization: Not on file    Attends meetings of clubs or organizations: Not on file    Relationship status: Not on file  Other Topics Concern  . Not on file  Social History Narrative  . Not on file    Hospital Course:  Briana Sexton was admitted for MDD (major depressive disorder)  with and crisis management.  Pt was treated discharged with the medications listed below under Medication List.  Medical problems were identified and treated as needed.  Home medications were restarted as appropriate.  Improvement was monitored by observation and Briana Sexton 's daily report of symptom reduction.  Emotional and mental status was monitored by daily self-inventory reports completed by Briana Sexton and clinical staff.         Briana Sexton was evaluated by the treatment team for stability and plans for continued recovery upon discharge. Briana Sexton 's motivation was an integral factor for scheduling further treatment. Employment, transportation, bed availability, health status, family support, and any pending legal issues were also considered during hospital stay. Pt was offered further treatment options upon discharge including but not limited to Residential, Intensive Outpatient, and Outpatient treatment.  Briana Sexton will  follow up with the services as listed below under Follow Up Information.     Upon completion of this admission the patient was both mentally and medically stable for discharge denying suicidal/homicidal ideation, auditory/visual/tactile hallucinations, delusional thoughts and paranoia.     Briana Sexton responded well to treatment with Lexapro 10 mg  without adverse effects. Pt demonstrated improvement without reported or observed adverse effects to the point of stability appropriate for outpatient management. Pertinent labs include:BMP and UDS+, for which outpatient follow-up is necessary for lab recheck as mentioned below. Reviewed CBC, CMP, BAL, and UDS; all unremarkable aside from noted exceptions.   Physical Findings: AIMS: Facial and Oral Movements Muscles of Facial Expression: None, normal Lips and Perioral Area: None, normal Jaw: None, normal Tongue: None, normal,Extremity Movements Upper (arms, wrists, hands, fingers): None, normal Lower (legs, knees, ankles, toes): None, normal, Trunk Movements Neck, shoulders, hips:  None, normal, Overall Severity Severity of abnormal movements (highest score from questions above): None, normal Incapacitation due to abnormal movements: None, normal Patient's awareness of abnormal movements (rate only patient's report): No Awareness, Dental Status Current problems with teeth and/or dentures?: No Does patient usually wear dentures?: No  CIWA:  CIWA-Ar Total: 0 COWS:  COWS Total Score: 0  Musculoskeletal: Strength & Muscle Tone: within normal limits Gait & Station: normal Patient leans: N/A  Psychiatric Specialty Exam: See SRA by MD Physical Exam  Vitals reviewed. Constitutional: She appears well-developed.  Cardiovascular: Normal rate.  Neurological: She is alert.  Psychiatric: She has a normal mood and affect. Her behavior is normal.    Review of Systems  Psychiatric/Behavioral: Negative for depression (stable) and substance abuse. The  patient is not nervous/anxious. Insomnia: improving    All other systems reviewed and are negative.   Blood pressure 114/76, pulse 74, temperature 98.2 F (36.8 C), temperature source Oral, resp. rate 20, height 5\' 6"  (1.676 m), weight 117.9 kg, SpO2 93 %.Body mass index is 41.97 kg/m.    Have you used any form of tobacco in the last 30 days? (Cigarettes, Smokeless Tobacco, Cigars, and/or Pipes): Yes  Has this patient used any form of tobacco in the last 30 days? (Cigarettes, Smokeless Tobacco, Cigars, and/or Pipes)  No  Blood Alcohol level:  No results found for: [facility]  Metabolic Disorder Labs:  No results found for: HGBA1C, MPG No results found for: PROLACTIN Lab Results  Component Value Date   CHOL 147 05/13/2018   TRIG 135 05/13/2018   HDL 33 (L) 05/13/2018   CHOLHDL 4.5 05/13/2018   VLDL 27 05/13/2018   LDLCALC 87 05/13/2018    See Psychiatric Specialty Exam and Suicide Risk Assessment completed by Attending Physician prior to discharge.  Discharge destination:  Home  Is patient on multiple antipsychotic therapies at discharge:  No   Has Patient had three or more failed trials of antipsychotic monotherapy by history:  No  Recommended Plan for Multiple Antipsychotic Therapies: NA  Discharge Instructions    Diet - low sodium heart healthy   Complete by:  As directed    Discharge instructions   Complete by:  As directed    Take all medications as prescribed. Keep all follow-up appointments as scheduled.  Do not consume alcohol or use illegal drugs while on prescription medications. Report any adverse effects from your medications to your primary care provider promptly.  In the event of recurrent symptoms or worsening symptoms, call 911, a crisis hotline, or go to the nearest emergency department for evaluation.   Increase activity slowly   Complete by:  As directed      Allergies as of 05/19/2018      Reactions   Corn-containing Products Nausea And Vomiting,  Swelling   Dairy Aid [lactase] Nausea And Vomiting   Peanut-containing Drug Products Hives, Swelling      Medication List    STOP taking these medications   ibuprofen 600 MG tablet Commonly known as:  ADVIL,MOTRIN   metoCLOPramide 10 MG tablet Commonly known as:  REGLAN   norgestimate-ethinyl estradiol 0.25-35 MG-MCG tablet Commonly known as:  ORTHO-CYCLEN,SPRINTEC,PREVIFEM     TAKE these medications     Indication  cetirizine 10 MG tablet Commonly known as:  ZYRTEC Take 1 tablet (10 mg total) by mouth daily.  Indication:  Acute Urticaria   escitalopram 10 MG tablet Commonly known as:  LEXAPRO Take 10 mg by mouth daily.  Indication:  Major Depressive  Disorder   metroNIDAZOLE 500 MG tablet Commonly known as:  FLAGYL Take 1 tablet (500 mg total) by mouth 2 (two) times daily.  Indication:  Bacterial Blood Poisoning   NEXPLANON 68 MG Impl implant Generic drug:  etonogestrel 1 each by Subdermal route once.  Indication:  Birth Control Treatment   nitrofurantoin (macrocrystal-monohydrate) 100 MG capsule Commonly known as:  MACROBID Take 1 capsule (100 mg total) by mouth 2 (two) times daily.  Indication:  Urinary Tract Infection      Follow-up Information    [facility]. Go on 05/16/2018.   Specialty:  Behavioral Health Why:  Appointment for therapy services is 05/16/18 at 10:00am with Elisha Ponder. Please be sure to bring any discharge paperwork from this hospitalization.  Contact information: [address] [place] [address] [phone]       [facility] Go on 06/02/2018.   Why:  Hospital follow up appointment is Monday, 06/02/2018 at 10:00am.  Contact information: [address] [place] [address] [phone]           Follow-up recommendations:  Activity:  as tolerated Diet:  heart healthy  Comments:  Take all medications as prescribed. Keep all follow-up appointments as scheduled.   Do not consume alcohol or use illegal drugs while on prescription medications. Report any adverse effects from your medications to your primary care provider promptly.  In the event of recurrent symptoms or worsening symptoms, call 911, a crisis hotline, or go to the nearest emergency department for evaluation.   Signed: Oneta Rack, NP 05/19/2018, 8:34 AM

## 2018-05-19 NOTE — Progress Notes (Signed)
  Adult Psychoeducational Group Note  Date:  05/19/2018 Time:  8:51 AM  Group Topic/Focus:  Wrap-Up Group:   The focus of this group is to help patients review their daily goal of treatment and discuss progress on daily workbooks.    Participation Level:  Active  Participation Quality:  Appropriate  Affect:  Appropriate  Cognitive:  Appropriate  Insight: Appropriate and Good  Engagement in Group:  Engaged  Modes of Intervention:  Discussion  Additional Comments:  Her day was a 8. Her goal work on what to do once she leaves here.  Charna Busmanobinson, Erina Hamme Long 05/19/2018, 8:51 AM

## 2018-05-19 NOTE — [facility] Suicide Risk Assessment (Deleted)
[facility]   Principal Problem: <principal problem not specified> Discharge Diagnoses:  Patient Active Problem List   Diagnosis Date Noted  . MDD (major depressive disorder) [F32.9] 05/12/2018  . Tobacco use [Z72.0] 10/09/2017  . Morbid obesity (HCC) [E66.01] 10/09/2017  . Calculus of gallbladder without cholecystitis without obstruction [K80.20]     Total Time spent with patient: 15 minutes  Musculoskeletal: Strength & Muscle Tone: within normal limits Gait & Station: normal Patient leans: N/A  Psychiatric Specialty Exam: Review of Systems  All other systems reviewed and are negative.   Blood pressure 114/76, pulse 74, temperature 98.2 F (36.8 C), temperature source Oral, resp. rate 20, height 5\' 6"  (1.676 m), weight 117.9 kg, SpO2 93 %.Body mass index is 41.97 kg/m.  General Appearance: Casual  Eye Contact::  Good  Speech:  Normal Rate409  Volume:  Normal  Mood:  Anxious  Affect:  Congruent  Thought Process:  Coherent and Descriptions of Associations: Intact  Orientation:  Full (Time, Place, and Person)  Thought Content:  Logical  Suicidal Thoughts:  No  Homicidal Thoughts:  No  Memory:  Immediate;   Fair Recent;   Fair Remote;   Fair  Judgement:  Fair  Insight:  Fair  Psychomotor Activity:  Normal  Concentration:  Fair  Recall:  FiservFair  Fund of Knowledge:Fair  Language: Fair  Akathisia:  Negative  Handed:  Right  AIMS (if indicated):     Assets:  Desire for Improvement Financial Resources/Insurance Housing Leisure Time Physical Health Resilience Social Support  Sleep:  Number of Hours: 6.75  Cognition: WNL  ADL's:  Intact   Mental Status Per Nursing Assessment::   On Admission:  Suicidal ideation indicated by patient, Self-harm thoughts  Demographic Factors:  Adolescent or young adult  Loss Factors: NA  Historical Factors: Impulsivity  Risk Reduction Factors:   Sense of responsibility to family, Living with  another person, especially a relative and Positive social support  Continued Clinical Symptoms:  Depression:   Impulsivity  Cognitive Features That Contribute To Risk:  None    Suicide Risk:  Minimal: No identifiable suicidal ideation.  Patients presenting with no risk factors but with morbid ruminations; may be classified as minimal risk based on the severity of the depressive symptoms  Follow-up Information    [facility]. Go on 05/16/2018.   Specialty:  Behavioral Health Why:  Appointment for therapy services is 05/16/18 at 10:00am with Elisha PonderAnita Pardo. Please be sure to bring any discharge paperwork from this hospitalization.  Contact information: [address] [place] [id number] [phone]       [facility] Go on 06/02/2018.   Why:  Hospital follow up appointment is Monday, 06/02/2018 at 10:00am.  Contact information: [address] [place] [id number] [phone]           Plan Of Care/Follow-up recommendations:  Activity:  ad lib  Antonieta PertGreg Lawson Perina Salvaggio, MD 05/19/2018, 8:05 AM

## 2018-05-19 NOTE — BHH Suicide Risk Assessment (Signed)
[facility]   Principal Problem: <principal problem not specified> Discharge Diagnoses:  Patient Active Problem List   Diagnosis Date Noted  . MDD (major depressive disorder) [F32.9] 05/12/2018  . Tobacco use [Z72.0] 10/09/2017  . Morbid obesity (HCC) [E66.01] 10/09/2017  . Calculus of gallbladder without cholecystitis without obstruction [K80.20]     Total Time spent with patient: 15 minutes  Musculoskeletal: Strength & Muscle Tone: within normal limits Gait & Station: normal Patient leans: N/A  Psychiatric Specialty Exam: Review of Systems  All other systems reviewed and are negative.   Blood pressure 114/76, pulse 74, temperature 98.2 F (36.8 C), temperature source Oral, resp. rate 20, height 5\' 6"  (1.676 m), weight 117.9 kg, SpO2 93 %.Body mass index is 41.97 kg/m.  General Appearance: Casual  Eye Contact::  Good  Speech:  Normal Rate409  Volume:  Normal  Mood:  Euthymic  Affect:  Congruent  Thought Process:  Coherent and Descriptions of Associations: Intact  Orientation:  Full (Time, Place, and Person)  Thought Content:  Logical  Suicidal Thoughts:  No  Homicidal Thoughts:  No  Memory:  Immediate;   Fair Recent;   Fair Remote;   Fair  Judgement:  Intact  Insight:  Fair  Psychomotor Activity:  Normal  Concentration:  Good  Recall:  Good  Fund of Knowledge:Good  Language: Good  Akathisia:  Negative  Handed:  Right  AIMS (if indicated):     Assets:  Communication Skills Desire for Improvement Housing Physical Health Resilience Social Support  Sleep:  Number of Hours: 6.75  Cognition: WNL  ADL's:  Intact   Mental Status Per Nursing Assessment::   On Admission:  Suicidal ideation indicated by patient, Self-harm thoughts  Demographic Factors:  NA  Loss Factors: NA  Historical Factors: Impulsivity  Risk Reduction Factors:   Sense of responsibility to family, Employed, Living with another person, especially a relative  and Positive social support  Continued Clinical Symptoms:  Severe Anxiety and/or Agitation Depression:   Impulsivity  Cognitive Features That Contribute To Risk:  None    Suicide Risk:  Minimal: No identifiable suicidal ideation.  Patients presenting with no risk factors but with morbid ruminations; may be classified as minimal risk based on the severity of the depressive symptoms  Follow-up Information    [facility] Go [facility].   Specialty:  Behavioral Health Why:  Appointment for therapy services is 05/16/18 at 10:00am with Elisha PonderAnita Pardo. Please be sure to bring any discharge paperwork from this hospitalization.  Contact information: [address] [place] [id number] [phone]       [facility] Go on 06/02/2018.   Why:  Hospital follow up appointment is Monday, 06/02/2018 at 10:00am.  Contact information: [address] [place] [id number] [phone]           Plan Of Care/Follow-up recommendations:  Activity:  ad lib  Antonieta PertGreg Lawson Sonia Stickels, MD 05/19/2018, 7:51 AM

## 2018-05-19 NOTE — BHH Group Notes (Signed)
Adult Psychoeducational Group Note  Date:  05/19/2018 Time:  8:50 AM  Group Topic/Focus:  Goals Group:   The focus of this group is to help patients establish daily goals to achieve during treatment and discuss how the patient can incorporate goal setting into their daily lives to aide in recovery.  Participation Level:  Active  Participation Quality:  Appropriate  Affect:  Appropriate  Cognitive:  Alert  Insight: Appropriate  Engagement in Group:  Engaged  Modes of Intervention:  Orientation  Additional Comments:  Pt attended orientation/goals group facilitated by MHT MacDilla.   Dellia NimsJaquesha M Esker Dever 05/19/2018, 8:50 AM

## 2018-05-19 NOTE — Progress Notes (Signed)
Recreation Therapy Notes  Date: 9.23.19 Time: 0930 Location: [address]  Group Topic: Stress Management  Goal Area(s) Addresses:  Patient will verbalize importance of using healthy stress management.  Patient will identify positive emotions associated with healthy stress management.   Intervention: Stress Management  Activity :  Guided Imagery.  LRT introduced the stress management technique of guided imagery.  LRT read a script to guide patients on a journey to their peaceful place.  Patients were to follow along as script was read.  Education:  Stress Management, Discharge Planning.   Education Outcome: Acknowledges edcuation/In group clarification offered/Needs additional education  Clinical Observations/Feedback: Pt did not attend group.      Ranferi Clingan, LRT/CTRS         Danil Wedge A 05/19/2018 12:14 PM 

## 2018-05-19 NOTE — Progress Notes (Signed)
  [facility] Discharge [facility] :  Will you be returning to the same living situation after discharge:  Yes,  patient reports she is returning to her home with her sister At discharge, do you have transportation home?: Yes,  patient reports her best-friend is picking her up at discharge Do you have the ability to pay for your medications: Yes,  [employer], income from employment  Release of information consent forms completed and in the chart;  Patient's signature needed at discharge.  Patient to Follow up at: Follow-up Information    [facility] Go [facility].   Specialty:  Behavioral Health Why:  Appointment for therapy services is 05/27/18 with Elisha PonderAnita Pardo. Please be sure to bring any discharge paperwork from this hospitalization.  Contact information: [address] [place] [id number] [phone]       [facility] Go on 06/02/2018.   Why:  Hospital follow up appointment is Monday, 06/02/2018 at 10:00am.  Contact information: [address] [place] [id number] [phone]           Next level of care provider [facility]:yes  Safety Planning and Suicide Prevention discussed: Yes,  with the patient's best-friend  Have you used any form of tobacco in the last 30 days? (Cigarettes, Smokeless Tobacco, Cigars, and/or Pipes): Yes  Has patient been referred to the Quitline?: Patient refused referral    Patient has been referred for addiction treatment: N/A  Maeola SarahJolan E Valeen Borys, LCSWA 05/19/2018, 10:23 AM

## 2018-05-26 ENCOUNTER — Encounter: Payer: Self-pay | Admitting: Radiology

## 2018-05-27 ENCOUNTER — Ambulatory Visit (INDEPENDENT_AMBULATORY_CARE_PROVIDER_SITE_OTHER): Payer: 59 | Admitting: Psychology

## 2018-05-27 DIAGNOSIS — F411 Generalized anxiety disorder: Secondary | ICD-10-CM | POA: Diagnosis not present

## 2018-05-30 ENCOUNTER — Ambulatory Visit: Payer: 59 | Admitting: Psychology

## 2018-06-05 ENCOUNTER — Ambulatory Visit (INDEPENDENT_AMBULATORY_CARE_PROVIDER_SITE_OTHER): Payer: 59 | Admitting: Psychology

## 2018-06-05 DIAGNOSIS — F411 Generalized anxiety disorder: Secondary | ICD-10-CM

## 2018-06-17 ENCOUNTER — Ambulatory Visit (INDEPENDENT_AMBULATORY_CARE_PROVIDER_SITE_OTHER): Payer: 59 | Admitting: Psychology

## 2018-06-17 DIAGNOSIS — F411 Generalized anxiety disorder: Secondary | ICD-10-CM | POA: Diagnosis not present

## 2018-06-26 ENCOUNTER — Ambulatory Visit (INDEPENDENT_AMBULATORY_CARE_PROVIDER_SITE_OTHER): Payer: 59 | Admitting: Psychology

## 2018-06-26 DIAGNOSIS — F411 Generalized anxiety disorder: Secondary | ICD-10-CM | POA: Diagnosis not present

## 2018-07-03 ENCOUNTER — Ambulatory Visit (INDEPENDENT_AMBULATORY_CARE_PROVIDER_SITE_OTHER): Payer: 59 | Admitting: Psychology

## 2018-07-03 DIAGNOSIS — F411 Generalized anxiety disorder: Secondary | ICD-10-CM | POA: Diagnosis not present

## 2018-07-10 ENCOUNTER — Ambulatory Visit: Payer: 59 | Admitting: Psychology

## 2018-08-29 ENCOUNTER — Ambulatory Visit: Payer: 59 | Admitting: Psychology

## 2018-10-20 ENCOUNTER — Ambulatory Visit (INDEPENDENT_AMBULATORY_CARE_PROVIDER_SITE_OTHER): Payer: 59 | Admitting: Psychology

## 2018-10-20 DIAGNOSIS — F411 Generalized anxiety disorder: Secondary | ICD-10-CM | POA: Diagnosis not present

## 2018-11-04 ENCOUNTER — Ambulatory Visit: Payer: Self-pay | Admitting: Psychology

## 2018-11-26 ENCOUNTER — Ambulatory Visit (INDEPENDENT_AMBULATORY_CARE_PROVIDER_SITE_OTHER): Payer: 59 | Admitting: Psychology

## 2018-11-26 DIAGNOSIS — F411 Generalized anxiety disorder: Secondary | ICD-10-CM | POA: Diagnosis not present

## 2018-12-05 ENCOUNTER — Ambulatory Visit (INDEPENDENT_AMBULATORY_CARE_PROVIDER_SITE_OTHER): Payer: 59 | Admitting: Psychology

## 2018-12-05 DIAGNOSIS — F411 Generalized anxiety disorder: Secondary | ICD-10-CM

## 2018-12-24 ENCOUNTER — Encounter: Payer: Self-pay | Admitting: *Deleted

## 2018-12-25 ENCOUNTER — Encounter: Payer: Self-pay | Admitting: Surgery

## 2018-12-25 ENCOUNTER — Other Ambulatory Visit: Payer: Self-pay

## 2018-12-25 ENCOUNTER — Ambulatory Visit (INDEPENDENT_AMBULATORY_CARE_PROVIDER_SITE_OTHER): Payer: 59 | Admitting: Surgery

## 2018-12-25 VITALS — BP 122/80 | HR 88 | Temp 97.3°F | Resp 16 | Ht 64.0 in | Wt 273.4 lb

## 2018-12-25 DIAGNOSIS — L0591 Pilonidal cyst without abscess: Secondary | ICD-10-CM

## 2018-12-25 NOTE — Progress Notes (Addendum)
Surgical Clinic Progress/Follow-up Note   HPI:  28 y.o. Female known to our practice s/p laparoscopic cholecystectomy Briana Sexton(Briana Sexton, 01/30/2017) presents to clinic for evaluation of her sacral-coccygeal cyst. Patient reports she first noticed a mildly painful lesion at the same location 6 months ago with 3 interval recurrences, but she says it remained only mildly uncomfortable until the past week, at which time it enlarged, became painful enough to restrict activity, and began draining a small amount of non-purulent fluid. She shares a series of photos, demonstrating this lesion prior to and over this past week. She was referred to a dermatologist for a virtual appointment, after which she was prescribed Keflex. 2 days later, she presented in person to a local urgent care facility for non-improvement, at which time Bactrim was added to her Keflex, since which her pain has improved significantly. She otherwise denies fever/chills, CP, or SOB.  Review of Systems:  Constitutional: denies any other weight loss, fever, chills, or sweats  Eyes: denies any other vision changes, history of eye injury  ENT: denies sore throat, hearing problems  Respiratory: denies shortness of breath, wheezing  Cardiovascular: denies chest pain, palpitations  Gastrointestinal: denies abdominal pain, N/V, or diarrhea Musculoskeletal: denies any other joint pains or cramps  Skin: sacral-coccygeal wound as per interval history  Neurological: denies any other headache, dizziness, weakness  Psychiatric: denies any other depression, anxiety  All other review of systems: otherwise negative   Vital Signs:  BP 122/80   Pulse 88   Temp (!) 97.3 F (36.3 C) (Temporal)   Resp 16   Ht 5\' 4"  (1.626 m)   Wt 273 lb 6.4 oz (124 kg)   SpO2 99%   BMI 46.93 kg/m    Physical Exam:  Constitutional:  -- Obese body habitus  -- Awake, alert, and oriented x3  Eyes:  -- Pupils equally round and reactive to light  -- No scleral icterus   Ear, nose, throat:  -- No jugular venous distension  -- No nasal drainage, bleeding Pulmonary:  -- No crackles -- Equal breath sounds bilaterally -- Breathing non-labored at rest Cardiovascular:  -- S1, S2 present  -- No pericardial rubs  Gastrointestinal:  -- Soft, nontender, non-distended, no guarding/rebound  -- No abdominal masses appreciated, pulsatile or otherwise  Musculoskeletal / Integumentary:  -- Wounds or skin discoloration: Right of midline minimally tender to palpation 1.5 cm focal induration without surrounding erythema or purulent drainage  -- Extremities: B/L UE and LE FROM, hands and feet warm, no edema  Neurologic:  -- Motor function: intact and symmetric  -- Sensation: intact and symmetric   Laboratory studies:  CBC Latest Ref Rng & Units 05/13/2018 02/14/2017 02/03/2017  WBC 4.0 - 10.5 K/uL 10.4 7.4 9.9  Hemoglobin 12.0 - 15.0 g/dL 16.112.8 09.612.3 04.513.1  Hematocrit 36.0 - 46.0 % 37.9 35.9 38.3  Platelets 150 - 400 K/uL 373 390 364   CMP Latest Ref Rng & Units 05/13/2018 02/14/2017 02/03/2017  Glucose 70 - 99 mg/dL 409(W121(H) 119(J101(H) 99  BUN 6 - 20 mg/dL 10 7 8   Creatinine 0.44 - 1.00 mg/dL 4.780.83 2.950.55 6.210.71  Sodium 135 - 145 mmol/L 138 135 136  Potassium 3.5 - 5.1 mmol/L 4.0 4.1 3.6  Chloride 98 - 111 mmol/L 104 105 102  CO2 22 - 32 mmol/L 26 24 26   Calcium 8.9 - 10.3 mg/dL 9.0 9.2 9.2  Total Protein 6.5 - 8.1 g/dL - 7.4 -  Total Bilirubin 0.3 - 1.2 mg/dL - 0.4 -  Alkaline  Phos 38 - 126 U/L - 69 -  AST 15 - 41 U/L - 21 -  ALT 14 - 54 U/L - 15 -   Imaging: No new pertinent imaging studies available for review at this time   Assessment:  28 y.o. yo Female with a problem list including...  Patient Active Problem List   Diagnosis Date Noted  . MDD (major depressive disorder) 05/12/2018  . Tobacco use 10/09/2017  . Morbid obesity (HCC) 10/09/2017  . Calculus of gallbladder without cholecystitis without obstruction     presents to clinic for evaluation of her  sacral-coccygeal sebaceous vs pilonidal cyst, currently without clinically appreciable abscess, complicated by comorbidities including morbid obesity (BMI 47), pre-diabetes, abnormal uterine bleeding, chronic ongoing tobacco abuse, and major depression disorder.  Plan:   - discussed with patient differential diagnoses and natural history of each  - advised patient to complete prescribed course of oral systemic antibiotics  - return to clinic in 1 month to reassess with hope to clarify diagnosis and plan for possible excision accordingly  - instructed to meanwhile call office if any questions or concerns  All of the above recommendations were discussed with the patient, and all of patient's questions were answered to her expressed satisfaction.  -- Scherrie Gerlach Briana Plater, MD, RPVI [place]: [facility] General Surgery - Partnering for exceptional care. Office: [phone]

## 2018-12-25 NOTE — Patient Instructions (Addendum)
Finish your antibiotics. Follow up here in 1 month. Call us with any questions or worsening symptoms.      Pilonidal Cyst  A pilonidal cyst is a fluid-filled sac that forms beneath the skin near the tailbone, at the top of the crease of the buttocks (pilonidal area). If the cyst is not large and not infected, it may not cause any problems. If the cyst becomes irritated or infected, it may get larger and fill with pus. An infected cyst is called an abscess. A pilonidal abscess may cause pain and swelling, and it may need to be drained or removed. What are the causes? The cause of this condition is not always known. In some cases, a hair that grows into your skin (ingrown hair) may be the cause. What increases the risk? You are more likely to get a pilonidal cyst if you:  Are female.  Have lots of hair near the crease of the buttocks.  Are overweight.  Have a dimple near the crease of the buttocks.  Wear tight clothing.  Do not bathe or shower often.  Sit for long periods of time. What are the signs or symptoms? Signs and symptoms of a pilonidal cyst may include pain, swelling, redness, and warmth in the pilonidal area. Depending on how big the cyst is, you may be able to feel a lump near your tailbone. If your cyst becomes infected, symptoms may include:  Pus or fluid drainage.  Fever.  Pain, swelling, and redness getting worse.  The lump getting bigger. How is this diagnosed? This condition may be diagnosed based on:  Your symptoms and medical history.  A physical exam.  A blood test to check for infection.  Testing a pus sample, if applicable. How is this treated? If your cyst does not cause symptoms, you may not need any treatment. If your cyst bothers you or is infected, you may need a procedure to drain or remove the cyst. Depending on the size, location, and severity of your cyst, your health care provider may:  Make an incision in the cyst and drain it (incision  and drainage).  Open and drain the cyst, and then stitch the wound so that it stays open while it heals (marsupialization). You will be given instructions about how to care for your open wound while it heals.  Remove all or part of the cyst, and then close the wound (cyst removal). You may need to take antibiotic medicines before your procedure. Follow these instructions at home: Medicines  Take over-the-counter and prescription medicines only as told by your health care provider.  If you were prescribed an antibiotic medicine, take it as told by your health care provider. Do not stop taking the antibiotic even if you start to feel better. General instructions  Keep the area around your pilonidal cyst clean and dry.  If there is fluid or pus draining from your cyst: ? Cover the area with a clean bandage (dressing) as needed. ? Wash the area gently with soap and water. Pat the area dry with a clean towel. Do not rub the area because that may cause bleeding.  Remove hair from the area around the cyst only if your health care provider tells you to do this.  Do not wear tight pants or sit in one position for long periods at a time.  Keep all follow-up visits as told by your health care provider. This is important. Contact a health care provider if you have:  New redness,  swelling, or pain.  A fever.  Severe pain. Summary  A pilonidal cyst is a fluid-filled sac that forms beneath the skin near the tailbone, at the top of the crease of the buttocks (pilonidal area).  If the cyst becomes irritated or infected, it may get larger and fill with pus. An infected cyst is called an abscess.  The cause of this condition is not always known. In some cases, a hair that grows into your skin (ingrown hair) may be the cause.  If your cyst does not cause symptoms, you may not need any treatment. If your cyst bothers you or is infected, you may need a procedure to drain or remove the cyst. This  information is not intended to replace advice given to you by your health care provider. Make sure you discuss any questions you have with your health care provider. Document Released: 08/10/2000 Document Revised: 08/01/2017 Document Reviewed: 08/01/2017 Elsevier Interactive Patient Education  2019 [employer].

## 2019-01-07 ENCOUNTER — Other Ambulatory Visit: Payer: Self-pay

## 2019-01-07 ENCOUNTER — Encounter: Payer: Self-pay | Admitting: *Deleted

## 2019-01-07 ENCOUNTER — Encounter: Payer: Self-pay | Admitting: Obstetrics and Gynecology

## 2019-01-07 ENCOUNTER — Ambulatory Visit (INDEPENDENT_AMBULATORY_CARE_PROVIDER_SITE_OTHER): Payer: 59 | Admitting: Obstetrics and Gynecology

## 2019-01-07 VITALS — BP 128/85 | HR 86 | Ht 63.0 in | Wt 273.0 lb

## 2019-01-07 DIAGNOSIS — Z3046 Encounter for surveillance of implantable subdermal contraceptive: Secondary | ICD-10-CM | POA: Diagnosis not present

## 2019-01-07 DIAGNOSIS — Z30017 Encounter for initial prescription of implantable subdermal contraceptive: Secondary | ICD-10-CM

## 2019-01-07 MED ORDER — ETONOGESTREL 68 MG ~~LOC~~ IMPL
68.0000 mg | DRUG_IMPLANT | Freq: Once | SUBCUTANEOUS | Status: AC
  Administered 2019-01-07: 68 mg via SUBCUTANEOUS

## 2019-01-07 NOTE — Procedures (Signed)
Nexplanon Removal and Insertion Procedure Note Prior to the procedure being performed, the patient (or guardian) was asked to state their full name, date of birth, type of procedure being performed and the exact location of the operative site. This information was then checked against the documentation in the patient's chart. Prior to the procedure being performed, a "time out" was performed by the physician that confirmed the correct patient, procedure and site.  Prior one expired 2 days ago.   After informed consent was obtained, the patient's left arm was examined and the Nexplanon rod was noted to be easily palpable. The old incision was cleaned with alcohol then local anesthesia was infiltrated with 3 ml of 1% lidocaine epinephrine. The area was prepped with betadine. Using sterile technique, the Nexplanon device was easily brought to the incision site. The capsule was scrapped off with the scalpel, the Nexplanon grasped with hemostats, and easily removed; the removal site was hemostatic. The Nexplanon was inspected and noted to be intact.  A new one was placed in the same incision per usual method. A steri-strip and a pressure dressing was applied.  The patient tolerated the procedure well.  Patient told to consider effective in 1 week.   Cornelia Copa MD Attending Center for [employer] [facility])

## 2019-01-22 ENCOUNTER — Ambulatory Visit (INDEPENDENT_AMBULATORY_CARE_PROVIDER_SITE_OTHER): Payer: 59 | Admitting: Surgery

## 2019-01-22 ENCOUNTER — Encounter: Payer: Self-pay | Admitting: Surgery

## 2019-01-22 ENCOUNTER — Other Ambulatory Visit: Payer: Self-pay

## 2019-01-22 VITALS — BP 148/98 | HR 88 | Temp 97.9°F | Ht 63.0 in | Wt 270.0 lb

## 2019-01-22 DIAGNOSIS — L723 Sebaceous cyst: Secondary | ICD-10-CM | POA: Diagnosis not present

## 2019-01-22 NOTE — Patient Instructions (Addendum)
The patient is aware to call back for any questions or new concerns.  Recommend Excision sebaceous cyst in the office, call back once she looks at her schedule.

## 2019-01-22 NOTE — Progress Notes (Signed)
Surgical Clinic Progress/Follow-up Note   HPI:  28 y.o. Female presents to clinic for follow-up evaluation of her recently infected sacral-coccygeal subcutaneous cyst. Patient reports she completed her prescribed course of antibiotics uneventfully with no further pain or drainage, denies fever/chills, N/V, CP, or SOB.  Review of Systems:  Constitutional: denies any other weight loss, fever, chills, or sweats  Eyes: denies any other vision changes, history of eye injury  ENT: denies sore throat, hearing problems  Respiratory: denies shortness of breath, wheezing  Cardiovascular: denies chest pain, palpitations  Gastrointestinal: denies abdominal pain, N/V, or diarrhea Musculoskeletal: denies any other joint pains or cramps  Skin: Denies any other rashes or skin discolorations except as per interval history Neurological: denies any other headache, dizziness, weakness  Psychiatric: denies any other depression, anxiety  All other review of systems: otherwise negative   Vital Signs:  BP (!) 148/98   Pulse 88   Temp 97.9 F (36.6 C) (Temporal)   Ht 5\' 3"  (1.6 m)   Wt 270 lb (122.5 kg)   SpO2 98%   BMI 47.83 kg/m    Physical Exam:  Constitutional:  -- Obese body habitus  -- Awake, alert, and oriented x3  Eyes:  -- Pupils equally round and reactive to light  -- No scleral icterus  Ear, nose, throat:  -- No jugular venous distension  -- No nasal drainage, bleeding Pulmonary:  -- No crackles -- Equal breath sounds bilaterally -- Breathing non-labored at rest Cardiovascular:  -- S1, S2 present  -- No pericardial rubs  Gastrointestinal:  -- Soft, nontender, non-distended, no guarding/rebound  -- No abdominal masses appreciated, pulsatile or otherwise  Musculoskeletal / Integumentary:  -- Wounds or skin discoloration: 1.5 - 2 cm Right of midline well-circumscribed firm and mobile non-tender to palpation subcutaneous mass without any surrounding erythema or drainage --  Extremities: B/L UE and LE FROM, hands and feet warm  Neurologic:  -- Motor function: intact and symmetric  -- Sensation: intact and symmetric   Imaging: No new pertinent imaging studies available for review at this time   Assessment:  28 y.o. yo Female with a problem list including...  Patient Active Problem List   Diagnosis Date Noted  . MDD (major depressive disorder) 05/12/2018  . Tobacco use 10/09/2017  . Morbid obesity (HCC) 10/09/2017  . Calculus of gallbladder without cholecystitis without obstruction     presents to clinic for follow-up evaluation of her recently infected and symptomatic sacral-coccygeal more likely sebaceous (rather than pilonidal) cyst, doing well, complicated by comorbidities including morbid obesity (BMI 47), pre-diabetes, abnormal uterine bleeding, chronic ongoing tobacco abuse, and major depression disorder.  Plan:              - discussed with patient natural history of more likely sebaceous cysts             - all risks, benefits, and alternatives to in-office excision of her sacral-coccygeal sebaceous cyst were discussed with the patient, all of her questions were answered to her expressed satisfaction, patient expresses she wishes to think about her decision, though will likely call to schedule, and informed consent was obtained.             - anticipate return to clinic 2 weeks following above elective in-office procedure             - meanwhile instructed to call office if any questions or concerns  All of the above recommendations were discussed with the patient, and all of patient's  questions were answered to her expressed satisfaction.  -- Scherrie GerlachJason E. Earlene Plateravis, MD, RPVI [place]: [facility] General Surgery - Partnering for exceptional care. Office: [phone]

## 2019-02-09 ENCOUNTER — Telehealth: Payer: Self-pay

## 2019-02-09 NOTE — Telephone Encounter (Signed)
Message left for the patient to call to schedule in office excision of sebaceous cyst. Will need 45 minutes for this per Dr Rosana Hoes.

## 2019-02-17 ENCOUNTER — Telehealth: Payer: Self-pay | Admitting: *Deleted

## 2019-02-17 ENCOUNTER — Encounter: Payer: Self-pay | Admitting: *Deleted

## 2019-02-17 NOTE — Telephone Encounter (Signed)
Tried to reach patient by phone today but no answer and not able to leave a message.   We need to get patient scheduled for a in office excision (45 minutes) with Dr. Rosana Hoes.   My Chart message sent to the patient today.

## 2019-03-04 ENCOUNTER — Telehealth: Payer: Self-pay | Admitting: *Deleted

## 2019-03-04 ENCOUNTER — Encounter: Payer: Self-pay | Admitting: *Deleted

## 2019-03-04 NOTE — Telephone Encounter (Signed)
Not able to reach patient by phone.   Letter mailed.

## 2019-04-28 ENCOUNTER — Ambulatory Visit (INDEPENDENT_AMBULATORY_CARE_PROVIDER_SITE_OTHER): Payer: 59 | Admitting: Psychology

## 2019-04-28 DIAGNOSIS — F411 Generalized anxiety disorder: Secondary | ICD-10-CM

## 2019-05-13 ENCOUNTER — Ambulatory Visit (INDEPENDENT_AMBULATORY_CARE_PROVIDER_SITE_OTHER): Payer: 59 | Admitting: Psychology

## 2019-05-13 DIAGNOSIS — F411 Generalized anxiety disorder: Secondary | ICD-10-CM | POA: Diagnosis not present

## 2019-05-26 ENCOUNTER — Ambulatory Visit (INDEPENDENT_AMBULATORY_CARE_PROVIDER_SITE_OTHER): Payer: 59 | Admitting: Psychology

## 2019-05-26 DIAGNOSIS — F411 Generalized anxiety disorder: Secondary | ICD-10-CM | POA: Diagnosis not present

## 2019-06-03 ENCOUNTER — Ambulatory Visit (INDEPENDENT_AMBULATORY_CARE_PROVIDER_SITE_OTHER): Payer: 59 | Admitting: Psychology

## 2019-06-03 DIAGNOSIS — F411 Generalized anxiety disorder: Secondary | ICD-10-CM

## 2019-06-15 ENCOUNTER — Ambulatory Visit (INDEPENDENT_AMBULATORY_CARE_PROVIDER_SITE_OTHER): Payer: 59 | Admitting: Psychology

## 2019-06-15 DIAGNOSIS — F411 Generalized anxiety disorder: Secondary | ICD-10-CM | POA: Diagnosis not present

## 2019-06-22 ENCOUNTER — Ambulatory Visit: Payer: 59 | Admitting: Psychology

## 2019-06-24 ENCOUNTER — Ambulatory Visit: Payer: 59 | Admitting: Psychology

## 2019-06-25 ENCOUNTER — Ambulatory Visit (INDEPENDENT_AMBULATORY_CARE_PROVIDER_SITE_OTHER): Payer: 59 | Admitting: Psychology

## 2019-06-25 DIAGNOSIS — F411 Generalized anxiety disorder: Secondary | ICD-10-CM

## 2019-07-01 ENCOUNTER — Ambulatory Visit (INDEPENDENT_AMBULATORY_CARE_PROVIDER_SITE_OTHER): Payer: 59 | Admitting: Psychology

## 2019-07-01 DIAGNOSIS — F411 Generalized anxiety disorder: Secondary | ICD-10-CM | POA: Diagnosis not present

## 2019-07-15 ENCOUNTER — Ambulatory Visit: Payer: 59 | Admitting: Psychology

## 2019-07-29 ENCOUNTER — Ambulatory Visit (INDEPENDENT_AMBULATORY_CARE_PROVIDER_SITE_OTHER): Payer: 59 | Admitting: Psychology

## 2019-07-29 DIAGNOSIS — F411 Generalized anxiety disorder: Secondary | ICD-10-CM

## 2019-08-04 ENCOUNTER — Ambulatory Visit: Payer: 59 | Admitting: Psychology

## 2019-08-11 ENCOUNTER — Ambulatory Visit (INDEPENDENT_AMBULATORY_CARE_PROVIDER_SITE_OTHER): Payer: 59 | Admitting: Psychology

## 2019-08-11 DIAGNOSIS — F411 Generalized anxiety disorder: Secondary | ICD-10-CM

## 2019-08-24 ENCOUNTER — Ambulatory Visit: Payer: 59 | Admitting: Psychology

## 2019-09-03 ENCOUNTER — Ambulatory Visit (INDEPENDENT_AMBULATORY_CARE_PROVIDER_SITE_OTHER): Payer: 59 | Admitting: Psychology

## 2019-09-03 DIAGNOSIS — F411 Generalized anxiety disorder: Secondary | ICD-10-CM

## 2019-09-11 ENCOUNTER — Ambulatory Visit (INDEPENDENT_AMBULATORY_CARE_PROVIDER_SITE_OTHER): Payer: 59 | Admitting: Psychology

## 2019-09-11 DIAGNOSIS — F411 Generalized anxiety disorder: Secondary | ICD-10-CM

## 2019-09-18 ENCOUNTER — Ambulatory Visit: Payer: 59 | Admitting: Psychology

## 2019-09-18 ENCOUNTER — Ambulatory Visit (INDEPENDENT_AMBULATORY_CARE_PROVIDER_SITE_OTHER): Payer: 59 | Admitting: Psychology

## 2019-09-18 DIAGNOSIS — F411 Generalized anxiety disorder: Secondary | ICD-10-CM

## 2019-10-08 ENCOUNTER — Ambulatory Visit: Payer: 59 | Admitting: Psychology

## 2019-10-09 ENCOUNTER — Ambulatory Visit (INDEPENDENT_AMBULATORY_CARE_PROVIDER_SITE_OTHER): Payer: 59 | Admitting: Psychology

## 2019-10-09 DIAGNOSIS — F411 Generalized anxiety disorder: Secondary | ICD-10-CM | POA: Diagnosis not present

## 2019-10-20 ENCOUNTER — Ambulatory Visit (INDEPENDENT_AMBULATORY_CARE_PROVIDER_SITE_OTHER): Payer: 59 | Admitting: Psychology

## 2019-10-20 DIAGNOSIS — F411 Generalized anxiety disorder: Secondary | ICD-10-CM | POA: Diagnosis not present

## 2019-10-27 ENCOUNTER — Ambulatory Visit (INDEPENDENT_AMBULATORY_CARE_PROVIDER_SITE_OTHER): Payer: 59 | Admitting: Psychology

## 2019-10-27 DIAGNOSIS — F411 Generalized anxiety disorder: Secondary | ICD-10-CM | POA: Diagnosis not present

## 2019-11-03 ENCOUNTER — Ambulatory Visit (INDEPENDENT_AMBULATORY_CARE_PROVIDER_SITE_OTHER): Payer: 59 | Admitting: Psychology

## 2019-11-03 DIAGNOSIS — F411 Generalized anxiety disorder: Secondary | ICD-10-CM | POA: Diagnosis not present

## 2019-11-12 ENCOUNTER — Ambulatory Visit (INDEPENDENT_AMBULATORY_CARE_PROVIDER_SITE_OTHER): Payer: 59 | Admitting: Psychology

## 2019-11-12 DIAGNOSIS — F411 Generalized anxiety disorder: Secondary | ICD-10-CM | POA: Diagnosis not present

## 2019-11-20 ENCOUNTER — Ambulatory Visit (INDEPENDENT_AMBULATORY_CARE_PROVIDER_SITE_OTHER): Payer: 59 | Admitting: Psychology

## 2019-11-20 DIAGNOSIS — F411 Generalized anxiety disorder: Secondary | ICD-10-CM

## 2019-12-01 ENCOUNTER — Ambulatory Visit: Payer: 59 | Admitting: Psychology

## 2019-12-10 ENCOUNTER — Ambulatory Visit (INDEPENDENT_AMBULATORY_CARE_PROVIDER_SITE_OTHER): Payer: 59 | Admitting: Psychology

## 2019-12-10 DIAGNOSIS — F411 Generalized anxiety disorder: Secondary | ICD-10-CM

## 2019-12-25 ENCOUNTER — Ambulatory Visit (INDEPENDENT_AMBULATORY_CARE_PROVIDER_SITE_OTHER): Payer: 59 | Admitting: Psychology

## 2019-12-25 DIAGNOSIS — F411 Generalized anxiety disorder: Secondary | ICD-10-CM

## 2019-12-30 ENCOUNTER — Ambulatory Visit (INDEPENDENT_AMBULATORY_CARE_PROVIDER_SITE_OTHER): Payer: 59 | Admitting: Psychology

## 2019-12-30 DIAGNOSIS — F411 Generalized anxiety disorder: Secondary | ICD-10-CM | POA: Diagnosis not present

## 2020-01-12 ENCOUNTER — Ambulatory Visit (INDEPENDENT_AMBULATORY_CARE_PROVIDER_SITE_OTHER): Payer: 59 | Admitting: Psychology

## 2020-01-12 DIAGNOSIS — F411 Generalized anxiety disorder: Secondary | ICD-10-CM | POA: Diagnosis not present

## 2020-01-27 ENCOUNTER — Ambulatory Visit (INDEPENDENT_AMBULATORY_CARE_PROVIDER_SITE_OTHER): Payer: 59 | Admitting: Psychology

## 2020-01-27 DIAGNOSIS — F411 Generalized anxiety disorder: Secondary | ICD-10-CM | POA: Diagnosis not present

## 2020-02-09 ENCOUNTER — Ambulatory Visit (INDEPENDENT_AMBULATORY_CARE_PROVIDER_SITE_OTHER): Payer: 59 | Admitting: Psychology

## 2020-02-09 DIAGNOSIS — F411 Generalized anxiety disorder: Secondary | ICD-10-CM

## 2020-02-15 ENCOUNTER — Ambulatory Visit (INDEPENDENT_AMBULATORY_CARE_PROVIDER_SITE_OTHER): Payer: 59 | Admitting: Psychology

## 2020-02-15 DIAGNOSIS — F411 Generalized anxiety disorder: Secondary | ICD-10-CM | POA: Diagnosis not present

## 2020-03-01 ENCOUNTER — Ambulatory Visit (INDEPENDENT_AMBULATORY_CARE_PROVIDER_SITE_OTHER): Payer: 59 | Admitting: Psychology

## 2020-03-01 DIAGNOSIS — F411 Generalized anxiety disorder: Secondary | ICD-10-CM | POA: Diagnosis not present

## 2020-03-02 ENCOUNTER — Ambulatory Visit: Payer: 59 | Admitting: Psychology

## 2020-03-08 ENCOUNTER — Ambulatory Visit (INDEPENDENT_AMBULATORY_CARE_PROVIDER_SITE_OTHER): Payer: 59 | Admitting: Psychology

## 2020-03-08 DIAGNOSIS — F411 Generalized anxiety disorder: Secondary | ICD-10-CM | POA: Diagnosis not present

## 2020-03-18 ENCOUNTER — Ambulatory Visit: Payer: 59 | Admitting: Psychology

## 2020-03-22 ENCOUNTER — Ambulatory Visit (INDEPENDENT_AMBULATORY_CARE_PROVIDER_SITE_OTHER): Payer: 59 | Admitting: Psychology

## 2020-03-22 DIAGNOSIS — F411 Generalized anxiety disorder: Secondary | ICD-10-CM | POA: Diagnosis not present

## 2020-04-11 ENCOUNTER — Ambulatory Visit (INDEPENDENT_AMBULATORY_CARE_PROVIDER_SITE_OTHER): Payer: 59 | Admitting: Psychology

## 2020-04-11 DIAGNOSIS — F411 Generalized anxiety disorder: Secondary | ICD-10-CM

## 2020-05-20 ENCOUNTER — Ambulatory Visit (INDEPENDENT_AMBULATORY_CARE_PROVIDER_SITE_OTHER): Payer: 59 | Admitting: Psychology

## 2020-05-20 DIAGNOSIS — F411 Generalized anxiety disorder: Secondary | ICD-10-CM | POA: Diagnosis not present

## 2020-06-22 ENCOUNTER — Ambulatory Visit (INDEPENDENT_AMBULATORY_CARE_PROVIDER_SITE_OTHER): Payer: 59 | Admitting: Psychology

## 2020-06-22 DIAGNOSIS — F411 Generalized anxiety disorder: Secondary | ICD-10-CM | POA: Diagnosis not present

## 2020-07-25 ENCOUNTER — Ambulatory Visit: Payer: 59 | Admitting: Psychology

## 2020-08-23 ENCOUNTER — Emergency Department (HOSPITAL_COMMUNITY)
Admission: EM | Admit: 2020-08-23 | Discharge: 2020-08-23 | Disposition: A | Discharge status: Home / Self Care | Payer: 59 | Attending: Emergency Medicine | Admitting: Emergency Medicine

## 2020-08-23 ENCOUNTER — Other Ambulatory Visit: Payer: Self-pay

## 2020-08-23 ENCOUNTER — Encounter (HOSPITAL_COMMUNITY): Payer: Self-pay

## 2020-08-23 DIAGNOSIS — Z79899 Other long term (current) drug therapy: Secondary | ICD-10-CM | POA: Diagnosis not present

## 2020-08-23 DIAGNOSIS — Z87891 Personal history of nicotine dependence: Secondary | ICD-10-CM | POA: Insufficient documentation

## 2020-08-23 DIAGNOSIS — B349 Viral infection, unspecified: Secondary | ICD-10-CM

## 2020-08-23 DIAGNOSIS — R058 Other specified cough: Secondary | ICD-10-CM | POA: Diagnosis present

## 2020-08-23 DIAGNOSIS — Z91010 Allergy to peanuts: Secondary | ICD-10-CM | POA: Diagnosis not present

## 2020-08-23 DIAGNOSIS — U071 COVID-19: Secondary | ICD-10-CM | POA: Insufficient documentation

## 2020-08-23 NOTE — ED Provider Notes (Signed)
[facility]-EMERGENCY DEPT Provider Note   CSN: [id number] Arrival date & time: 08/23/20  1625     History Chief Complaint  Patient presents with  . Covid Exposure  . Sore Throat    Briana Sexton is a 29 y.o. female present emerge department for Covid test.  The patient ports she received 2 doses of the Covid vaccine several months ago.  She does not receive the booster.  She got to a family get together at Christmas and found out that one of her cousins was positive for Covid.  Today she began having a sore throat.  She describes very mild dry cough, no other active symptoms.  She denies any history of asthma, smoking, lung problems.  HPI     Past Medical History:  Diagnosis Date  . Abnormal uterine bleeding   . Depression   . GERD (gastroesophageal reflux disease)   . Headache    MIGRAINES  . Pre-diabetes     Patient Active Problem List   Diagnosis Date Noted  . MDD (major depressive disorder) 05/12/2018  . Tobacco use 10/09/2017  . Morbid obesity (HCC) 10/09/2017  . Calculus of gallbladder without cholecystitis without obstruction     Past Surgical History:  Procedure Laterality Date  . CHOLECYSTECTOMY N/A 01/30/2017   Procedure: LAPAROSCOPIC CHOLECYSTECTOMY;  Surgeon: Ancil Linsey, MD;  Location: [facility] ORS;  Service: General;  Laterality: N/A;  . NO PAST SURGERIES       OB History    Gravida  0   Para  0   Term  0   Preterm  0   AB  0   Living  0     SAB  0   IAB  0   Ectopic  0   Multiple  0   Live Births  0           Family History  Problem Relation Age of Onset  . Hypertension Sister   . Diabetes Maternal Uncle   . Thyroid disease Mother     Social History   Tobacco Use  . Smoking status: Former Smoker    Packs/day: 0.50    Years: 3.00    Pack years: 1.50    Types: Cigarettes  . Smokeless tobacco: Never Used  Vaping Use  . Vaping Use: Never used  Substance Use Topics  . Alcohol use: Yes     Alcohol/week: 0.0 standard drinks    Comment: OCC-ONCE WEEKLY  . Drug use: Not Currently    Types: Marijuana    Comment: VERY RARE    Home Medications Prior to Admission medications   Medication Sig Start Date End Date Taking? Authorizing Provider  cetirizine (ZYRTEC) 10 MG tablet Take 1 tablet (10 mg total) by mouth daily. 07/31/16   Rebecka Apley, MD  escitalopram (LEXAPRO) 10 MG tablet Take 1 tablet (10 mg total) by mouth daily. 05/19/18   Oneta Rack, NP  etonogestrel (NEXPLANON) 68 MG IMPL implant 1 each by Subdermal route once.    [provider]    Allergies    Corn-containing products, Dairy aid [lactase], and Peanut-containing drug products  Review of Systems   Review of Systems  Constitutional: Negative for chills and fever.  HENT: Positive for congestion and sore throat.   Eyes: Negative for pain and visual disturbance.  Respiratory: Positive for cough. Negative for shortness of breath.   Cardiovascular: Negative for chest pain and palpitations.  Gastrointestinal: Negative for abdominal pain and vomiting.  Genitourinary: Negative for dysuria and hematuria.  Musculoskeletal: Positive for arthralgias and myalgias.  Skin: Negative for color change and rash.  Neurological: Negative for syncope and numbness.  All other systems reviewed and are negative.   Physical Exam Updated Vital Signs BP (!) 152/88 (BP Location: Left Arm)   Pulse 100   Temp 99.1 F (37.3 C) (Oral)   Resp 17   Ht 5\' 3"  (1.6 m)   Wt 134.3 kg   SpO2 95%   BMI 52.45 kg/m   Physical Exam Vitals and nursing note reviewed.  Constitutional:      General: She is not in acute distress.    Appearance: She is well-developed and well-nourished. She is obese.  HENT:     Head: Normocephalic and atraumatic.  Eyes:     Conjunctiva/sclera: Conjunctivae normal.  Cardiovascular:     Rate and Rhythm: Normal rate and regular rhythm.     Heart sounds: Normal heart sounds.  Pulmonary:      Effort: Pulmonary effort is normal. No respiratory distress.     Breath sounds: Normal breath sounds.  Abdominal:     Palpations: Abdomen is soft.     Tenderness: There is no abdominal tenderness.  Musculoskeletal:        General: No edema.     Cervical back: Neck supple.  Skin:    General: Skin is warm and dry.  Neurological:     General: No focal deficit present.     Mental Status: She is alert and oriented to person, place, and time.  Psychiatric:        Mood and Affect: Mood and affect and mood normal.        Behavior: Behavior normal.     ED Results / Procedures / Treatments   Labs (all labs ordered are listed, but only abnormal results are displayed) Labs Reviewed  RESP PANEL BY RT-PCR (FLU A&B, COVID) ARPGX2    EKG None  Radiology No results found.  Procedures Procedures (including critical care time)  Medications Ordered in ED Medications - No data to display  ED Course  I have reviewed the triage vital signs and the nursing notes.  Pertinent labs & imaging results that were available during my care of the patient were reviewed by me and considered in my medical decision making (see chart for details).  29 year old female here with viral-like illness for 1 day.  Concerned about Covid exposure.  She has no hypoxia and her lung sounds are clear.  Very low suspicion for bacterial pneumonia, sepsis, or other life-threatening infection at this time.  I suspect this is viral illness.  We can swab her for Covid and flu and discharge her.  Briana Sexton was evaluated in Emergency Department on 08/23/2020 for the symptoms described in the history of present illness. She was evaluated in the context of the global COVID-19 pandemic, which necessitated consideration that the patient might be at risk for infection with the SARS-CoV-2 virus that causes COVID-19. Institutional protocols and algorithms that pertain to the evaluation of patients at risk for COVID-19 are in a state  of rapid change based on information released by regulatory bodies including the CDC and federal and state organizations. These policies and algorithms were followed during the patient's care in the ED.    Final Clinical Impression(s) / ED Diagnoses Final diagnoses:  Viral illness    Rx / DC Orders ED Discharge Orders    None       Mathilda Maguire, 08/25/2020,  MD 08/23/20 2145

## 2020-08-23 NOTE — ED Triage Notes (Signed)
Patient states she was exposed to Covid on 08/20/20. Patientt c/o sore throat, muscle pain and cough that started to day.

## 2020-08-23 NOTE — Discharge Instructions (Signed)
You were tested in the ER today for Covid 19 and Influenza.  Your results should return by late tonight or early tomorrow.  If you test NEGATIVE, you can return to work.  If you test POSITIVE for either illness, you should quarantine at home.  Covid quarantine should last for 10 days from the onset of your symptoms (until January 6th, 2022).  A work note is provided for this.

## 2020-08-24 LAB — RESP PANEL BY RT-PCR (FLU A&B, COVID) ARPGX2
Influenza A by PCR: NEGATIVE
Influenza B by PCR: NEGATIVE
SARS Coronavirus 2 by RT PCR: POSITIVE — AB

## 2020-10-19 ENCOUNTER — Ambulatory Visit (INDEPENDENT_AMBULATORY_CARE_PROVIDER_SITE_OTHER): Payer: 59 | Admitting: Psychology

## 2020-10-19 DIAGNOSIS — F411 Generalized anxiety disorder: Secondary | ICD-10-CM

## 2020-10-28 ENCOUNTER — Ambulatory Visit (INDEPENDENT_AMBULATORY_CARE_PROVIDER_SITE_OTHER): Payer: 59 | Admitting: Psychology

## 2020-10-28 DIAGNOSIS — F411 Generalized anxiety disorder: Secondary | ICD-10-CM

## 2020-11-11 ENCOUNTER — Ambulatory Visit: Payer: 59 | Admitting: Psychology

## 2020-11-21 ENCOUNTER — Ambulatory Visit (INDEPENDENT_AMBULATORY_CARE_PROVIDER_SITE_OTHER): Payer: 59 | Admitting: Psychology

## 2020-11-21 DIAGNOSIS — F411 Generalized anxiety disorder: Secondary | ICD-10-CM | POA: Diagnosis not present

## 2020-11-30 ENCOUNTER — Ambulatory Visit: Payer: 59 | Admitting: Psychology

## 2020-12-07 ENCOUNTER — Ambulatory Visit (INDEPENDENT_AMBULATORY_CARE_PROVIDER_SITE_OTHER): Payer: 59 | Admitting: Psychology

## 2020-12-07 DIAGNOSIS — F411 Generalized anxiety disorder: Secondary | ICD-10-CM | POA: Diagnosis not present

## 2020-12-27 ENCOUNTER — Ambulatory Visit: Payer: 59 | Admitting: Psychology

## 2022-06-04 DIAGNOSIS — F4310 Post-traumatic stress disorder, unspecified: Secondary | ICD-10-CM | POA: Diagnosis not present

## 2022-08-03 DIAGNOSIS — F4310 Post-traumatic stress disorder, unspecified: Secondary | ICD-10-CM | POA: Diagnosis not present

## 2022-09-21 DIAGNOSIS — F4310 Post-traumatic stress disorder, unspecified: Secondary | ICD-10-CM | POA: Diagnosis not present

## 2022-11-21 DIAGNOSIS — F4310 Post-traumatic stress disorder, unspecified: Secondary | ICD-10-CM | POA: Diagnosis not present

## 2022-11-28 DIAGNOSIS — Z3046 Encounter for surveillance of implantable subdermal contraceptive: Secondary | ICD-10-CM | POA: Diagnosis not present

## 2023-01-03 DIAGNOSIS — F4310 Post-traumatic stress disorder, unspecified: Secondary | ICD-10-CM | POA: Diagnosis not present

## 2023-03-13 DIAGNOSIS — Z91018 Allergy to other foods: Secondary | ICD-10-CM | POA: Diagnosis not present

## 2023-03-13 DIAGNOSIS — Z760 Encounter for issue of repeat prescription: Secondary | ICD-10-CM | POA: Diagnosis not present

## 2023-04-01 ENCOUNTER — Ambulatory Visit
Admission: RE | Admit: 2023-04-01 | Discharge: 2023-04-01 | Disposition: A | Discharge status: Home / Self Care | Payer: BC Managed Care – PPO | Source: Ambulatory Visit | Attending: Physician Assistant | Admitting: Physician Assistant

## 2023-04-01 ENCOUNTER — Other Ambulatory Visit: Payer: Self-pay

## 2023-04-01 VITALS — BP 160/108 | HR 93 | Temp 98.0°F | Resp 18

## 2023-04-01 DIAGNOSIS — H65193 Other acute nonsuppurative otitis media, bilateral: Secondary | ICD-10-CM

## 2023-04-01 DIAGNOSIS — U071 COVID-19: Secondary | ICD-10-CM

## 2023-04-01 MED ORDER — AMOXICILLIN-POT CLAVULANATE 875-125 MG PO TABS: 1.0000 | ORAL_TABLET | Freq: Two times a day (BID) | ORAL | 0 refills | Status: DC

## 2023-04-01 NOTE — ED Triage Notes (Signed)
03/25/2023 tested positive for covid with home test.  Sore throat developed 4-5 days ago.  Left ear is stuffy and intermittent pain  Dayquil, nyquil, mucinex

## 2023-04-01 NOTE — ED Provider Notes (Signed)
Briana Sexton    CSN: [id number] Arrival date & time: 04/01/23  1851      History   Chief Complaint Chief Complaint  Patient presents with   Sore Throat    Covid sore throat feels more intense just want to make sure it's not anything else. - Entered by patient    HPI Briana Sexton is a 32 y.o. female.   Patient here today for evaluation of continued sore throat and ear pain after testing positive for COVID about a week ago.  She states that her cough has improved mildly.  She has been taking DayQuil NyQuil and Mucinex with some improvement.  She denies any recent fever.  She has not had any vomiting or diarrhea.  The history is provided by the patient.  Sore Throat Pertinent negatives include no abdominal pain and no shortness of breath.    Past Medical History:  Diagnosis Date   Abnormal uterine bleeding    Depression    GERD (gastroesophageal reflux disease)    Headache    MIGRAINES   Pre-diabetes     Patient Active Problem List   Diagnosis Date Noted   MDD (major depressive disorder) 05/12/2018   Tobacco use 10/09/2017   Morbid obesity (HCC) 10/09/2017   Calculus of gallbladder without cholecystitis without obstruction     Past Surgical History:  Procedure Laterality Date   CHOLECYSTECTOMY N/A 01/30/2017   Procedure: LAPAROSCOPIC CHOLECYSTECTOMY;  Surgeon: Ancil Linsey, MD;  Location: [facility] ORS;  Service: General;  Laterality: N/A;   NO PAST SURGERIES      OB History     Gravida  0   Para  0   Term  0   Preterm  0   AB  0   Living  0      SAB  0   IAB  0   Ectopic  0   Multiple  0   Live Births  0            Home Medications    Prior to Admission medications   Medication Sig Start Date End Date Taking? Authorizing Provider  amoxicillin-clavulanate (AUGMENTIN) 875-125 MG tablet Take 1 tablet by mouth every 12 (twelve) hours. 04/01/23  Yes Tomi Bamberger, PA-C  cetirizine (ZYRTEC) 10 MG tablet Take 1 tablet (10 mg  total) by mouth daily. Patient not taking: Reported on 04/01/2023 07/31/16   Rebecka Apley, MD  escitalopram (LEXAPRO) 10 MG tablet Take 1 tablet (10 mg total) by mouth daily. Patient not taking: Reported on 04/01/2023 05/19/18   Oneta Rack, NP  etonogestrel (NEXPLANON) 68 MG IMPL implant 1 each by Subdermal route once. Patient not taking: Reported on 04/01/2023    [provider]    Family History Family History  Problem Relation Age of Onset   Hypertension Sister    Diabetes Maternal Uncle    Thyroid disease Mother     Social History Social History   Tobacco Use   Smoking status: Some Days    Current packs/day: 0.50    Average packs/day: 0.5 packs/day for 3.0 years (1.5 ttl pk-yrs)    Types: Cigarettes   Smokeless tobacco: Never  Vaping Use   Vaping status: Never Used  Substance Use Topics   Alcohol use: Yes    Alcohol/week: 0.0 standard drinks of alcohol    Comment: OCC-ONCE WEEKLY   Drug use: Not Currently    Types: Marijuana    Comment: VERY RARE  Allergies   Corn-containing products, Dairy aid [tilactase], and Peanut-containing drug products   Review of Systems Review of Systems  Constitutional:  Negative for chills and fever.  HENT:  Positive for congestion, ear pain, sinus pressure and sore throat.   Eyes:  Negative for discharge and redness.  Respiratory:  Positive for cough. Negative for shortness of breath and wheezing.   Gastrointestinal:  Negative for abdominal pain, diarrhea, nausea and vomiting.     Physical Exam Triage Vital Signs ED Triage Vitals  Encounter Vitals Group     BP      Systolic BP Percentile      Diastolic BP Percentile      Pulse      Resp      Temp      Temp src      SpO2      Weight      Height      Head Circumference      Peak Flow      Pain Score      Pain Loc      Pain Education      Exclude from Growth Chart    No data found.  Updated Vital Signs BP (!) 160/108   Pulse 93   Temp 98 F (36.7  C)   Resp 18   LMP 03/16/2023   SpO2 98%      Physical Exam Vitals and nursing note reviewed.  Constitutional:      General: She is not in acute distress.    Appearance: Normal appearance. She is not ill-appearing.  HENT:     Head: Normocephalic and atraumatic.     Right Ear: Tympanic membrane is erythematous.     Left Ear: Tympanic membrane is erythematous.     Nose: Congestion present.     Mouth/Throat:     Mouth: Mucous membranes are moist.     Pharynx: No oropharyngeal exudate or posterior oropharyngeal erythema.  Eyes:     Conjunctiva/sclera: Conjunctivae normal.  Cardiovascular:     Rate and Rhythm: Normal rate and regular rhythm.     Heart sounds: Normal heart sounds. No murmur heard. Pulmonary:     Effort: Pulmonary effort is normal. No respiratory distress.     Breath sounds: Normal breath sounds. No wheezing, rhonchi or rales.  Skin:    General: Skin is warm and dry.  Neurological:     Mental Status: She is alert.  Psychiatric:        Mood and Affect: Mood normal.        Thought Content: Thought content normal.      UC Treatments / Results  Labs (all labs ordered are listed, but only abnormal results are displayed) Labs Reviewed - No data to display  EKG   Radiology No results found.  Procedures Procedures (including critical care time)  Medications Ordered in UC Medications - No data to display  Initial Impression / Assessment and Plan / UC Course  I have reviewed the triage vital signs and the nursing notes.  Pertinent labs & imaging results that were available during my care of the patient were reviewed by me and considered in my medical decision making (see chart for details).    Will treat with Augmentin to cover otitis media as well as possible impending sinusitis given continued congestion post COVID.  Recommended follow-up if no gradual improvement or with any further concerns.  Final Clinical Impressions(s) / UC Diagnoses   Final  diagnoses:  Other  acute nonsuppurative otitis media of both ears, recurrence not specified  COVID-19   Discharge Instructions   None    ED Prescriptions     Medication Sig Dispense Auth. Provider   amoxicillin-clavulanate (AUGMENTIN) 875-125 MG tablet Take 1 tablet by mouth every 12 (twelve) hours. 14 tablet Tomi Bamberger, PA-C      PDMP not reviewed this encounter.   Tomi Bamberger, PA-C 04/01/23 1918

## 2023-04-05 DIAGNOSIS — Z1151 Encounter for screening for human papillomavirus (HPV): Secondary | ICD-10-CM | POA: Diagnosis not present

## 2023-04-05 DIAGNOSIS — Z1339 Encounter for screening examination for other mental health and behavioral disorders: Secondary | ICD-10-CM | POA: Diagnosis not present

## 2023-04-05 DIAGNOSIS — Z1331 Encounter for screening for depression: Secondary | ICD-10-CM | POA: Diagnosis not present

## 2023-04-05 DIAGNOSIS — Z124 Encounter for screening for malignant neoplasm of cervix: Secondary | ICD-10-CM | POA: Diagnosis not present

## 2023-04-05 DIAGNOSIS — R946 Abnormal results of thyroid function studies: Secondary | ICD-10-CM | POA: Diagnosis not present

## 2023-04-05 DIAGNOSIS — Z113 Encounter for screening for infections with a predominantly sexual mode of transmission: Secondary | ICD-10-CM | POA: Diagnosis not present

## 2023-04-05 DIAGNOSIS — Z131 Encounter for screening for diabetes mellitus: Secondary | ICD-10-CM | POA: Diagnosis not present

## 2023-04-05 DIAGNOSIS — R5383 Other fatigue: Secondary | ICD-10-CM | POA: Diagnosis not present

## 2023-04-05 DIAGNOSIS — Z01411 Encounter for gynecological examination (general) (routine) with abnormal findings: Secondary | ICD-10-CM | POA: Diagnosis not present

## 2023-05-06 DIAGNOSIS — F4310 Post-traumatic stress disorder, unspecified: Secondary | ICD-10-CM | POA: Diagnosis not present

## 2023-09-24 ENCOUNTER — Telehealth: Payer: BC Managed Care – PPO | Admitting: Physician Assistant

## 2023-09-24 DIAGNOSIS — M545 Low back pain, unspecified: Secondary | ICD-10-CM

## 2023-09-24 MED ORDER — CYCLOBENZAPRINE HCL 10 MG PO TABS: 10.0000 mg | ORAL_TABLET | Freq: Three times a day (TID) | ORAL | 0 refills | Status: AC | PRN

## 2023-09-24 NOTE — Patient Instructions (Addendum)
Maple Mirza, thank you for joining Piedad Climes, PA-C for today's virtual visit.  While this provider is not your primary care provider (PCP), if your PCP is located in our provider database this encounter information will be shared with them immediately following your visit.   A [facility] MyChart account gives you access to today's visit and all your visits, tests, and labs performed at [facility] " click here if you don't have a [facility] MyChart account or go to [email or web address]  Consent: (Patient) Briana Sexton provided verbal consent for this virtual visit at the beginning of the encounter.  Current Medications:  Current Outpatient Medications:    amoxicillin-clavulanate (AUGMENTIN) 875-125 MG tablet, Take 1 tablet by mouth every 12 (twelve) hours., Disp: 14 tablet, Rfl: 0   cetirizine (ZYRTEC) 10 MG tablet, Take 1 tablet (10 mg total) by mouth daily. (Patient not taking: Reported on 04/01/2023), Disp: 15 tablet, Rfl: 0   escitalopram (LEXAPRO) 10 MG tablet, Take 1 tablet (10 mg total) by mouth daily. (Patient not taking: Reported on 04/01/2023), Disp: 30 tablet, Rfl: 0   etonogestrel (NEXPLANON) 68 MG IMPL implant, 1 each by Subdermal route once. (Patient not taking: Reported on 04/01/2023), Disp: , Rfl:    Medications ordered in this encounter:  No orders of the defined types were placed in this encounter.    *If you need refills on other medications prior to your next appointment, please contact your pharmacy*  Follow-Up: Call back or seek an in-person evaluation if the symptoms worsen or if the condition fails to improve as anticipated.  [facility] [phone]  Other Instructions Lumbosacral Strain A lumbosacral strain is an injury that causes pain in the lower back (lumbosacral spine). This injury usually happens from overstretching the muscles or ligaments along the spine. Ligaments are cord-like tissues that connect bones to each  other. A strain can affect one or more muscles or ligaments. What are the causes? This condition may be caused by: A hard, direct hit to the back. Overstretching the lower back muscles. This may result from: A fall. Lifting something heavy. Repeated movements such as bending or crouching. What increases the risk? The following factors make you more likely to have a lumbosacral strain: Taking part in sports or activities that involve: A sudden twist of the back. Pushing or pulling motions. Being overweight or obese. Having poor strength and flexibility, especially tight hamstrings or weak muscles in the back or abdomen. Having too much of a curve in the lower back. Having a pelvis that is tilted forward. What are the signs or symptoms? The main symptom of this condition is pain in the lower back, at the site of the strain. Pain may also be felt down one or both legs. How is this diagnosed? This condition is diagnosed based on: Your symptoms and medical history. A physical exam. During the exam: Your health care provider may push on certain areas of your back to find the source of your pain. You may be asked to bend forward, backward, and side to side to check your pain and range of motion. You may also have imaging tests, such as X-rays and an MRI. How is this treated? This condition may be treated by: Applying heat and cold to the affected area. Taking medicines for pain and to relax your muscles. Taking NSAIDs, such as ibuprofen, to help reduce swelling and discomfort. Doing stretching and strengthening exercises for your lower back. Symptoms usually improve within several weeks  of treatment. But recovery time varies. When your symptoms improve, gradually return to your normal routine as soon as possible. This will help reduce pain, avoid stiffness, and keep muscle strength. Follow these instructions at home: Medicines Take over-the-counter and prescription medicines only as told  by your health care provider. Ask your health care provider if the medicine prescribed to you: Requires you to avoid driving or using machinery. Can cause constipation. You may need to take these actions to prevent or treat constipation: Drink enough fluid to keep your urine pale yellow. Take over-the-counter or prescription medicines. Eat foods that are high in fiber, such as beans, whole grains, and fresh fruits and vegetables. Limit foods that are high in fat and processed sugars, such as fried or sweet foods. Managing pain, stiffness, and swelling     If told, put ice on the injured area. Put ice in a plastic bag. Place a towel between your skin and the bag. Leave the ice on for 20 minutes, 2-3 times a day. If told, apply heat to the affected area as often as told by your health care provider. Use the heat source that your health care provider recommends, such as a moist heat pack or a heating pad. Place a towel between your skin and the heat source. Leave the heat on for 20-30 minutes. If your skin turns bright red, remove the heat or ice right away to prevent skin damage. The risk of damage is higher if you cannot feel pain, heat, or cold. Activity Rest as told by your health care provider. Do not stay in bed. Staying in bed for more than 1-2 days can delay your recovery. Return to your normal activities as told by your health care provider. Ask your health care provider what activities are safe for you. Avoid activities that take a lot of energy for as long as told by your health care provider. Do exercises as told by your health care provider. This includes stretching and strengthening exercises. General instructions     Use good posture when sitting and standing. Avoid leaning forward when you sit, and avoid hunching over when you stand. Do not use any products that contain nicotine or tobacco. These products include cigarettes, chewing tobacco, and vaping devices, such as  e-cigarettes. If you need help quitting, ask your health care provider. How is this prevented?  Warm up properly before physical activity, and cool down and stretch after being active. Use correct form when playing sports. Bend your knees and use correct posture when lifting heavy objects. Maintain a healthy weight. Sleep on a mattress with medium firmness to support your back. Do at least 150 minutes of moderate-intensity exercise each week, such as brisk walking or water aerobics. Try a form of exercise that takes stress off your back, such as swimming or stationary cycling. Maintain physical fitness, including: Strength. Flexibility. Contact a health care provider if: Your back pain does not improve after several weeks of treatment. Your symptoms get worse. You have a fever. Get help right away if: Your back pain is severe. You cannot stand or walk. You feel nauseous or you vomit. You develop any of the following: Trouble controlling when you urinate or when you have a bowel movement. Pain in your legs. Your feet or legs get very cold, turn pale, or look blue. Weakness in your buttocks or legs. This information is not intended to replace advice given to you by your health care provider. Make sure you discuss any questions  you have with your health care provider. Document Revised: 12/17/2022 Document Reviewed: 03/06/2022 Elsevier Patient Education  2024 [employer]   If you have been instructed to have an in-person evaluation today at a local Urgent Care facility, please use the link below. It will take you to a list of all of our available [facility], including address, phone number and hours of operation. Please do not delay care.  [facility]  If you or a family member do not have a primary care provider, use the link below to schedule a visit and establish care. When you choose a [facility] primary care physician or advanced practice provider, you  gain a long-term partner in health. Find a Primary Care Provider  Learn more about [facility] in-office and virtual care options: [facility] Get Care Now

## 2023-09-24 NOTE — Progress Notes (Signed)
Virtual Visit Consent   Briana Sexton, you are scheduled for a virtual visit with a [facility] provider today. Just as with appointments in the office, your consent must be obtained to participate. Your consent will be active for this visit and any virtual visit you may have with one of our providers in the next 365 days. If you have a MyChart account, a copy of this consent can be sent to you electronically.  As this is a virtual visit, video technology does not allow for your provider to perform a traditional examination. This may limit your provider's ability to fully assess your condition. If your provider identifies any concerns that need to be evaluated in person or the need to arrange testing (such as labs, EKG, etc.), we will make arrangements to do so. Although advances in technology are sophisticated, we cannot ensure that it will always work on either your end or our end. If the connection with a video visit is poor, the visit may have to be switched to a telephone visit. With either a video or telephone visit, we are not always able to ensure that we have a secure connection.  By engaging in this virtual visit, you consent to the provision of healthcare and authorize for your insurance to be billed (if applicable) for the services provided during this visit. Depending on your insurance coverage, you may receive a charge related to this service.  I need to obtain your verbal consent now. Are you willing to proceed with your visit today? Briana Sexton has provided verbal consent on 09/24/2023 for a virtual visit (video or telephone). Piedad Climes, [place]  Date: 09/24/2023 2:47 PM  Virtual Visit via Video Note   I, Piedad Climes, connected with  Briana Sexton  ([id number], 08-11-1991) on 09/24/23 at  2:45 PM EST by a video-enabled telemedicine application and verified that I am speaking with the correct person using two identifiers.  Location: Patient: Virtual Visit Location  Patient: Home Provider: Virtual Visit Location Provider: Home Office   I discussed the limitations of evaluation and management by telemedicine and the availability of in person appointments. The patient expressed understanding and agreed to proceed.    History of Present Illness: Briana Sexton is a 33 y.o. who identifies as a female who was assigned female at birth, and is being seen today for some low back pain noted yesterday. Notes she thinks is a combination of physically demanding job, poor sleep. Notes symptoms starting yesterday with bilateral pulling lower back pain, non radiating, and muscle spasm. Worsening while trying to clean her car. Some positions improve pain while others can worsen it somewhat.  OTC -- Tylenol arthritis  No concerns for pregnancy.   HPI: HPI  Problems:  Patient Active Problem List   Diagnosis Date Noted   MDD (major depressive disorder) 05/12/2018   Tobacco use 10/09/2017   Morbid obesity (HCC) 10/09/2017   Calculus of gallbladder without cholecystitis without obstruction     Allergies:  Allergies  Allergen Reactions   Corn-Containing Products Nausea And Vomiting and Swelling   Dairy Aid [Tilactase] Nausea And Vomiting   Peanut-Containing Drug Products Hives and Swelling   Walnut    Medications:  Current Outpatient Medications:    cyclobenzaprine (FLEXERIL) 10 MG tablet, Take 1 tablet (10 mg total) by mouth 3 (three) times daily as needed for muscle spasms., Disp: 30 tablet, Rfl: 0  Observations/Objective: Patient is well-developed, well-nourished in no acute distress.  Resting comfortably at home.  Head  is normocephalic, atraumatic.  No labored breathing. Speech is clear and coherent with logical content.  Patient is alert and oriented at baseline.   Assessment and Plan: 1. Acute bilateral low back pain without sciatica (Primary) - cyclobenzaprine (FLEXERIL) 10 MG tablet; Take 1 tablet (10 mg total) by mouth 3 (three) times daily as needed  for muscle spasms.  Dispense: 30 tablet; Refill: 0  Supportive measures and OTC medications reviewed. Continue Tylenol arthritis but alternate with Ibuprofen OTC. Heating pad to start. Flexeril per orders. Strict in person follow-up precautions reviewed. Work note provided.   Follow Up Instructions: I discussed the assessment and treatment plan with the patient. The patient was provided an opportunity to ask questions and all were answered. The patient agreed with the plan and demonstrated an understanding of the instructions.  A copy of instructions were sent to the patient via MyChart unless otherwise noted below.   The patient was advised to call back or seek an in-person evaluation if the symptoms worsen or if the condition fails to improve as anticipated.    Piedad Climes, PA-C

## 2023-10-11 DIAGNOSIS — Z1329 Encounter for screening for other suspected endocrine disorder: Secondary | ICD-10-CM | POA: Diagnosis not present

## 2024-02-03 ENCOUNTER — Ambulatory Visit
Admission: EM | Admit: 2024-02-03 | Discharge: 2024-02-03 | Disposition: A | Discharge status: Home / Self Care | Attending: Emergency Medicine | Admitting: Emergency Medicine

## 2024-02-03 DIAGNOSIS — J029 Acute pharyngitis, unspecified: Secondary | ICD-10-CM

## 2024-02-03 DIAGNOSIS — J069 Acute upper respiratory infection, unspecified: Secondary | ICD-10-CM

## 2024-02-03 LAB — POCT RAPID STREP A (OFFICE): Rapid Strep A Screen: NEGATIVE

## 2024-02-03 NOTE — ED Triage Notes (Signed)
 Pt reports being seen for sore throat, muscle pain, and nasal congestion that began today. Pt denies fevers. No known sick contacts.

## 2024-02-03 NOTE — ED Provider Notes (Signed)
 [facility]    CSN: [id number] Arrival date & time: 02/03/24  1837      History   Chief Complaint Chief Complaint  Patient presents with   Nasal Congestion   Sore Throat   Muscle Pain    HPI Briana Sexton is a 33 y.o. female.  Patient presents with sore throat, muscle aches, nasal congestion since this morning.  No fever, cough, shortness of breath, vomiting, diarrhea.  No OTC medications taken today.  The history is provided by the patient and medical records.    Past Medical History:  Diagnosis Date   Abnormal uterine bleeding    Depression    GERD (gastroesophageal reflux disease)    Headache    MIGRAINES   Pre-diabetes     Patient Active Problem List   Diagnosis Date Noted   MDD (major depressive disorder) 05/12/2018   Tobacco use 10/09/2017   Morbid obesity (HCC) 10/09/2017   Calculus of gallbladder without cholecystitis without obstruction     Past Surgical History:  Procedure Laterality Date   CHOLECYSTECTOMY N/A 01/30/2017   Procedure: LAPAROSCOPIC CHOLECYSTECTOMY;  Surgeon: Franki Isles, MD;  Location: [facility] ORS;  Service: General;  Laterality: N/A;   NO PAST SURGERIES      OB History     Gravida  0   Para  0   Term  0   Preterm  0   AB  0   Living  0      SAB  0   IAB  0   Ectopic  0   Multiple  0   Live Births  0            Home Medications    Prior to Admission medications   Medication Sig Start Date End Date Taking? Authorizing Provider  cyclobenzaprine  (FLEXERIL ) 10 MG tablet Take 1 tablet (10 mg total) by mouth 3 (three) times daily as needed for muscle spasms. Patient not taking: Reported on 02/03/2024 09/24/23   Farris Hong, PA-C    Family History Family History  Problem Relation Age of Onset   Hypertension Sister    Diabetes Maternal Uncle    Thyroid  disease Mother     Social History Social History   Tobacco Use   Smoking status: Some Days    Current packs/day: 0.50    Average  packs/day: 0.5 packs/day for 3.0 years (1.5 ttl pk-yrs)    Types: Cigarettes   Smokeless tobacco: Never  Vaping Use   Vaping status: Never Used  Substance Use Topics   Alcohol use: Yes    Alcohol/week: 0.0 standard drinks of alcohol    Comment: OCC-ONCE WEEKLY   Drug use: Not Currently    Types: Marijuana    Comment: VERY RARE     Allergies   Corn-containing products, Dairy aid [tilactase], Peanut-containing drug products, and Walnut   Review of Systems Review of Systems  Constitutional:  Negative for chills and fever.  HENT:  Positive for congestion and sore throat. Negative for ear pain.   Respiratory:  Negative for cough and shortness of breath.   Gastrointestinal:  Negative for diarrhea and vomiting.  Musculoskeletal:  Positive for myalgias. Negative for gait problem and joint swelling.     Physical Exam Triage Vital Signs ED Triage Vitals  Encounter Vitals Group     BP 02/03/24 1851 111/79     Systolic BP Percentile --      Diastolic BP Percentile --      Pulse Rate  02/03/24 1851 80     Resp 02/03/24 1851 19     Temp 02/03/24 1851 98.2 F (36.8 C)     Temp Source 02/03/24 1851 Tympanic     SpO2 02/03/24 1851 98 %     Weight --      Height --      Head Circumference --      Peak Flow --      Pain Score 02/03/24 1845 5     Pain Loc --      Pain Education --      Exclude from Growth Chart --    No data found.  Updated Vital Signs BP 111/79 (BP Location: Right Arm)   Pulse 80   Temp 98.2 F (36.8 C) (Tympanic)   Resp 19   LMP 01/13/2024   SpO2 98%   Visual Acuity Right Eye Distance:   Left Eye Distance:   Bilateral Distance:    Right Eye Near:   Left Eye Near:    Bilateral Near:     Physical Exam Constitutional:      General: She is not in acute distress. HENT:     Right Ear: Tympanic membrane normal.     Left Ear: Tympanic membrane normal.     Nose: Rhinorrhea present.     Mouth/Throat:     Mouth: Mucous membranes are moist.      Pharynx: Posterior oropharyngeal erythema present.  Cardiovascular:     Rate and Rhythm: Normal rate and regular rhythm.     Heart sounds: Normal heart sounds.  Pulmonary:     Effort: Pulmonary effort is normal. No respiratory distress.     Breath sounds: Normal breath sounds.  Neurological:     Mental Status: She is alert.      UC Treatments / Results  Labs (all labs ordered are listed, but only abnormal results are displayed) Labs Reviewed  POCT RAPID STREP A (OFFICE) - Normal    EKG   Radiology No results found.  Procedures Procedures (including critical care time)  Medications Ordered in UC Medications - No data to display  Initial Impression / Assessment and Plan / UC Course  I have reviewed the triage vital signs and the nursing notes.  Pertinent labs & imaging results that were available during my care of the patient were reviewed by me and considered in my medical decision making (see chart for details).    Sore throat, viral URI.  Afebrile and vital signs are stable.  Rapid strep negative.  Discussed symptomatic treatment including Tylenol  or ibuprofen  as needed.  Instructed patient to follow up with her PCP if her symptoms are not improving.  She agrees to plan of care.   Final Clinical Impressions(s) / UC Diagnoses   Final diagnoses:  Sore throat  Viral URI     Discharge Instructions      The strep test is negative.  Follow-up with your primary care provider if your symptoms are not improving.    ED Prescriptions   None    PDMP not reviewed this encounter.   Wellington Half, NP 02/03/24 Trenia Fritter

## 2024-02-03 NOTE — Discharge Instructions (Addendum)
The strep test is negative.  Follow up with your primary care provider if your symptoms are not improving.    

## 2024-02-05 ENCOUNTER — Ambulatory Visit
Admission: EM | Admit: 2024-02-05 | Discharge: 2024-02-05 | Disposition: A | Discharge status: Home / Self Care | Attending: Emergency Medicine | Admitting: Emergency Medicine

## 2024-02-05 DIAGNOSIS — J029 Acute pharyngitis, unspecified: Secondary | ICD-10-CM | POA: Insufficient documentation

## 2024-02-05 LAB — POCT RAPID STREP A (OFFICE): Rapid Strep A Screen: NEGATIVE

## 2024-02-05 LAB — POCT MONO SCREEN (KUC): Mono, POC: NEGATIVE

## 2024-02-05 MED ORDER — AMOXICILLIN-POT CLAVULANATE 875-125 MG PO TABS: 1.0000 | ORAL_TABLET | Freq: Two times a day (BID) | ORAL | 0 refills | Status: AC

## 2024-02-05 MED ORDER — PREDNISONE 10 MG (21) PO TBPK: ORAL_TABLET | Freq: Every day | ORAL | 0 refills | Status: AC

## 2024-02-05 MED ORDER — LIDOCAINE VISCOUS HCL 2 % MT SOLN: 15.0000 mL | OROMUCOSAL | 0 refills | Status: AC | PRN

## 2024-02-05 NOTE — ED Triage Notes (Signed)
 Patient presents to [facility] for continued sore throat since 06/09. Requesting mono and strep test.

## 2024-02-05 NOTE — Discharge Instructions (Signed)
 Your symptoms today are most likely being caused by a virus and should steadily improve in time it can take up to 7 to 10 days before you truly start to see a turnaround however things will get better improvement seen by Sunday may pick up Augmentin  which is an antibiotic from the pharmacy  Rapid strep test is negative, has been sent to the lab to see if bacteria will grow, you will be notified of positive results  Monotest negative  Begin use of prednisone  every morning with food to reduce inflammation and help with pain, may take Tylenol  additionally  May gargle and spit lidocaine  solution every 4 hours as needed for temporary relief to the throat   For cough: honey 1/2 to 1 teaspoon (you can dilute the honey in water or another fluid).  You can also use guaifenesin and dextromethorphan for cough. You can use a humidifier for chest congestion and cough.  If you don't have a humidifier, you can sit in the bathroom with the hot shower running.      For sore throat: try warm salt water gargles, cepacol lozenges, throat spray, warm tea or water with lemon/honey, popsicles or ice, or OTC cold relief medicine for throat discomfort.   For congestion: take a daily anti-histamine like Zyrtec , Claritin, and a oral decongestant, such as pseudoephedrine.  You can also use Flonase 1-2 sprays in each nostril daily.   It is important to stay hydrated: drink plenty of fluids (water, gatorade/powerade/pedialyte, juices, or teas) to keep your throat moisturized and help further relieve irritation/discomfort.

## 2024-02-05 NOTE — ED Provider Notes (Signed)
 [facility]    CSN: [id number] Arrival date & time: 02/05/24  1416      History   Chief Complaint Chief Complaint  Patient presents with   Sore Throat    HPI Briana Sexton is a 33 y.o. female.   Patient presents for evaluation of a persisting sore throat present for 2 days.  Also experiencing nasal congestion and a mild nonproductive cough beginning today.  Decreased appetite but able to tolerate food and liquids.  No known sick contacts prior.  Has attempted use of DayQuil NyQuil and pseudoephedrine.  Denies fever or abdominal symptoms.  Was evaluated in this urgent care 2 days ago, rapid strep test negative.  Past Medical History:  Diagnosis Date   Abnormal uterine bleeding    Depression    GERD (gastroesophageal reflux disease)    Headache    MIGRAINES   Pre-diabetes     Patient Active Problem List   Diagnosis Date Noted   MDD (major depressive disorder) 05/12/2018   Tobacco use 10/09/2017   Morbid obesity (HCC) 10/09/2017   Calculus of gallbladder without cholecystitis without obstruction     Past Surgical History:  Procedure Laterality Date   CHOLECYSTECTOMY N/A 01/30/2017   Procedure: LAPAROSCOPIC CHOLECYSTECTOMY;  Surgeon: Franki Isles, MD;  Location: [facility] ORS;  Service: General;  Laterality: N/A;   NO PAST SURGERIES      OB History     Gravida  0   Para  0   Term  0   Preterm  0   AB  0   Living  0      SAB  0   IAB  0   Ectopic  0   Multiple  0   Live Births  0            Home Medications    Prior to Admission medications   Medication Sig Start Date End Date Taking? Authorizing Provider  amoxicillin -clavulanate (AUGMENTIN ) 875-125 MG tablet Take 1 tablet by mouth every 12 (twelve) hours. 02/09/24  Yes Lilyrose Tanney R, NP  lidocaine  (XYLOCAINE ) 2 % solution Use as directed 15 mLs in the mouth or throat every 4 (four) hours as needed. 02/05/24  Yes Christen Wardrop R, NP  predniSONE  (STERAPRED UNI-PAK 21 TAB) 10 MG  (21) TBPK tablet Take by mouth daily. Take 6 tabs by mouth daily  for 1 days, then 5 tabs for 1 days, then 4 tabs for 1 days, then 3 tabs for 1 days, 2 tabs for 1 days, then 1 tab by mouth daily for 1 days 02/05/24  Yes Christi Wirick R, NP  cyclobenzaprine  (FLEXERIL ) 10 MG tablet Take 1 tablet (10 mg total) by mouth 3 (three) times daily as needed for muscle spasms. Patient not taking: Reported on 02/03/2024 09/24/23   Farris Hong, PA-C    Family History Family History  Problem Relation Age of Onset   Hypertension Sister    Diabetes Maternal Uncle    Thyroid  disease Mother     Social History Social History   Tobacco Use   Smoking status: Some Days    Current packs/day: 0.50    Average packs/day: 0.5 packs/day for 3.0 years (1.5 ttl pk-yrs)    Types: Cigarettes   Smokeless tobacco: Never  Vaping Use   Vaping status: Never Used  Substance Use Topics   Alcohol use: Yes    Alcohol/week: 0.0 standard drinks of alcohol    Comment: OCC-ONCE WEEKLY   Drug use: Not Currently  Types: Marijuana    Comment: VERY RARE     Allergies   Corn-containing products, Dairy aid [tilactase], Peanut-containing drug products, and Walnut   Review of Systems Review of Systems   Physical Exam Triage Vital Signs ED Triage Vitals [02/05/24 1439]  Encounter Vitals Group     BP 114/82     Systolic BP Percentile      Diastolic BP Percentile      Pulse Rate 82     Resp 16     Temp 98.7 F (37.1 C)     Temp Source Temporal     SpO2 96 %     Weight      Height      Head Circumference      Peak Flow      Pain Score      Pain Loc      Pain Education      Exclude from Growth Chart    No data found.  Updated Vital Signs BP 114/82 (BP Location: Left Arm)   Pulse 82   Temp 98.7 F (37.1 C) (Temporal)   Resp 16   LMP 01/13/2024   SpO2 96%   Visual Acuity Right Eye Distance:   Left Eye Distance:   Bilateral Distance:    Right Eye Near:   Left Eye Near:    Bilateral Near:      Physical Exam Constitutional:      Appearance: Normal appearance.  HENT:     Right Ear: Tympanic membrane and ear canal normal.     Left Ear: Tympanic membrane and ear canal normal.     Nose: Congestion present.     Mouth/Throat:     Pharynx: Posterior oropharyngeal erythema present.     Tonsils: No tonsillar exudate. 2+ on the right. 2+ on the left.  Cardiovascular:     Rate and Rhythm: Normal rate and regular rhythm.     Pulses: Normal pulses.     Heart sounds: Normal heart sounds.  Pulmonary:     Effort: Pulmonary effort is normal.     Breath sounds: Normal breath sounds.  Musculoskeletal:        General: Normal range of motion.     Cervical back: Normal range of motion.  Lymphadenopathy:     Cervical: Cervical adenopathy present.  Skin:    General: Skin is warm and dry.  Neurological:     Mental Status: She is alert.      UC Treatments / Results  Labs (all labs ordered are listed, but only abnormal results are displayed) Labs Reviewed  POCT MONO SCREEN ([facility]) - Normal  POCT RAPID STREP A (OFFICE) - Normal  CULTURE, GROUP A STREP [facility])    EKG   Radiology No results found.  Procedures Procedures (including critical care time)  Medications Ordered in UC Medications - No data to display  Initial Impression / Assessment and Plan / UC Course  I have reviewed the triage vital signs and the nursing notes.  Pertinent labs & imaging results that were available during my care of the patient were reviewed by me and considered in my medical decision making (see chart for details).  Viral pharyngitis, sore throat  Vital signs are stable, patient in no signs of distress nontoxic-appearing, erythema and tonsillar adenopathy present on exam, no exudate noted, rapid strep test negative, second testing since symptoms began, sent for culture, monotest negative, discussed all findings, etiology most likely viral, prescribed prednisone  and viscous lidocaine  for supportive  care and watch and wait antibiotic placed at pharmacy if no improvement seen, recommended supportive care with follow-up as needed Final Clinical Impressions(s) / UC Diagnoses   Final diagnoses:  Sore throat  Viral pharyngitis   Discharge Instructions      Your symptoms today are most likely being caused by a virus and should steadily improve in time it can take up to 7 to 10 days before you truly start to see a turnaround however things will get better improvement seen by Sunday may pick up Augmentin  which is an antibiotic from the pharmacy  Rapid strep test is negative, has been sent to the lab to see if bacteria will grow, you will be notified of positive results  Monotest negative  Begin use of prednisone  every morning with food to reduce inflammation and help with pain, may take Tylenol  additionally  May gargle and spit lidocaine  solution every 4 hours as needed for temporary relief to the throat   For cough: honey 1/2 to 1 teaspoon (you can dilute the honey in water or another fluid).  You can also use guaifenesin and dextromethorphan for cough. You can use a humidifier for chest congestion and cough.  If you don't have a humidifier, you can sit in the bathroom with the hot shower running.      For sore throat: try warm salt water gargles, cepacol lozenges, throat spray, warm tea or water with lemon/honey, popsicles or ice, or OTC cold relief medicine for throat discomfort.   For congestion: take a daily anti-histamine like Zyrtec , Claritin, and a oral decongestant, such as pseudoephedrine.  You can also use Flonase 1-2 sprays in each nostril daily.   It is important to stay hydrated: drink plenty of fluids (water, gatorade/powerade/pedialyte, juices, or teas) to keep your throat moisturized and help further relieve irritation/discomfort.   ED Prescriptions     Medication Sig Dispense Auth. Provider   predniSONE  (STERAPRED UNI-PAK 21 TAB) 10 MG (21) TBPK tablet Take by mouth  daily. Take 6 tabs by mouth daily  for 1 days, then 5 tabs for 1 days, then 4 tabs for 1 days, then 3 tabs for 1 days, 2 tabs for 1 days, then 1 tab by mouth daily for 1 days 21 tablet Enrique Manganaro R, NP   lidocaine  (XYLOCAINE ) 2 % solution Use as directed 15 mLs in the mouth or throat every 4 (four) hours as needed. 100 mL Azan Maneri R, NP   amoxicillin -clavulanate (AUGMENTIN ) 875-125 MG tablet Take 1 tablet by mouth every 12 (twelve) hours. 14 tablet Aaron Boeh R, NP      PDMP not reviewed this encounter.   Reena Canning, [place] 02/05/24 [phone]

## 2024-02-08 LAB — CULTURE, GROUP A STREP (THRC)

## 2024-02-10 ENCOUNTER — Ambulatory Visit (HOSPITAL_COMMUNITY): Payer: Self-pay

## 2024-02-27 DIAGNOSIS — F4310 Post-traumatic stress disorder, unspecified: Secondary | ICD-10-CM | POA: Diagnosis not present

## 2024-03-27 DIAGNOSIS — Z131 Encounter for screening for diabetes mellitus: Secondary | ICD-10-CM | POA: Diagnosis not present

## 2024-03-27 DIAGNOSIS — Z1159 Encounter for screening for other viral diseases: Secondary | ICD-10-CM | POA: Diagnosis not present

## 2024-03-27 DIAGNOSIS — Z114 Encounter for screening for human immunodeficiency virus [HIV]: Secondary | ICD-10-CM | POA: Diagnosis not present

## 2024-03-27 DIAGNOSIS — R4189 Other symptoms and signs involving cognitive functions and awareness: Secondary | ICD-10-CM | POA: Diagnosis not present

## 2024-03-27 DIAGNOSIS — Z0000 Encounter for general adult medical examination without abnormal findings: Secondary | ICD-10-CM | POA: Diagnosis not present

## 2024-03-27 DIAGNOSIS — Z13220 Encounter for screening for lipoid disorders: Secondary | ICD-10-CM | POA: Diagnosis not present

## 2024-03-27 DIAGNOSIS — Z1331 Encounter for screening for depression: Secondary | ICD-10-CM | POA: Diagnosis not present

## 2024-04-16 DIAGNOSIS — F339 Major depressive disorder, recurrent, unspecified: Secondary | ICD-10-CM | POA: Diagnosis not present

## 2024-04-16 DIAGNOSIS — Z01411 Encounter for gynecological examination (general) (routine) with abnormal findings: Secondary | ICD-10-CM | POA: Diagnosis not present

## 2024-05-27 DIAGNOSIS — F4310 Post-traumatic stress disorder, unspecified: Secondary | ICD-10-CM | POA: Diagnosis not present

## 2024-06-05 DIAGNOSIS — F4310 Post-traumatic stress disorder, unspecified: Secondary | ICD-10-CM | POA: Diagnosis not present

## 2024-06-10 ENCOUNTER — Telehealth: Admitting: Physician Assistant

## 2024-06-10 DIAGNOSIS — R3989 Other symptoms and signs involving the genitourinary system: Secondary | ICD-10-CM | POA: Diagnosis not present

## 2024-06-10 MED ORDER — NITROFURANTOIN MONOHYD MACRO 100 MG PO CAPS
100.0000 mg | ORAL_CAPSULE | Freq: Two times a day (BID) | ORAL | 0 refills | Status: AC
Start: 2024-06-10 — End: ?

## 2024-06-10 MED ORDER — PHENAZOPYRIDINE HCL 100 MG PO TABS: 100.0000 mg | ORAL_TABLET | Freq: Three times a day (TID) | ORAL | 0 refills | Status: AC | PRN

## 2024-06-10 NOTE — Progress Notes (Signed)
 Virtual Visit Consent   Briana Sexton, you are scheduled for a virtual visit with a [facility] provider today. Just as with appointments in the office, your consent must be obtained to participate. Your consent will be active for this visit and any virtual visit you may have with one of our providers in the next 365 days. If you have a MyChart account, a copy of this consent can be sent to you electronically.  As this is a virtual visit, video technology does not allow for your provider to perform a traditional examination. This may limit your provider's ability to fully assess your condition. If your provider identifies any concerns that need to be evaluated in person or the need to arrange testing (such as labs, EKG, etc.), we will make arrangements to do so. Although advances in technology are sophisticated, we cannot ensure that it will always work on either your end or our end. If the connection with a video visit is poor, the visit may have to be switched to a telephone visit. With either a video or telephone visit, we are not always able to ensure that we have a secure connection.  By engaging in this virtual visit, you consent to the provision of healthcare and authorize for your insurance to be billed (if applicable) for the services provided during this visit. Depending on your insurance coverage, you may receive a charge related to this service.  I need to obtain your verbal consent now. Are you willing to proceed with your visit today? Briana Sexton has provided verbal consent on 06/10/2024 for a virtual visit (video or telephone). Briana CHRISTELLA Dickinson, PA-C  Date: 06/10/2024 12:46 PM   Virtual Visit via Video Note   IDelon CHRISTELLA Sexton, connected with  Briana Sexton  ([id number], 1990/10/22) on 06/10/24 at 12:30 PM EDT by a video-enabled telemedicine application and verified that I am speaking with the correct person using two identifiers.  Location: Patient: Virtual Visit Location  Patient: Mobile Provider: Virtual Visit Location Provider: Home Office   I discussed the limitations of evaluation and management by telemedicine and the availability of in person appointments. The patient expressed understanding and agreed to proceed.    History of Present Illness: Briana Sexton is a 33 y.o. who identifies as a female who was assigned female at birth, and is being seen today for dysuria.  HPI: Urinary Tract Infection  This is a new problem. The current episode started in the past 7 days (1-2 days). The problem occurs every urination. The problem has been gradually worsening. The quality of the pain is described as burning. The pain is mild. There has been no fever. She is Sexually active (just ended menstrual period yesterday). There is No history of pyelonephritis. Associated symptoms include chills (but house is cold and weather, so unsure), frequency, hematuria, hesitancy, sweats and urgency. Pertinent negatives include no discharge, flank pain, nausea or possible pregnancy. She has tried increased fluids (AZO) for the symptoms. The treatment provided no relief.     Problems:  Patient Active Problem List   Diagnosis Date Noted   MDD (major depressive disorder) 05/12/2018   Tobacco use 10/09/2017   Morbid obesity (HCC) 10/09/2017   Calculus of gallbladder without cholecystitis without obstruction     Allergies:  Allergies  Allergen Reactions   Corn-Containing Products Nausea And Vomiting and Swelling   Dairy Aid [Tilactase] Nausea And Vomiting   Peanut-Containing Drug Products Hives and Swelling   Walnut    Medications:  Current  Outpatient Medications:    nitrofurantoin , macrocrystal-monohydrate, (MACROBID ) 100 MG capsule, Take 1 capsule (100 mg total) by mouth 2 (two) times daily., Disp: 10 capsule, Rfl: 0   phenazopyridine (PYRIDIUM) 100 MG tablet, Take 1 tablet (100 mg total) by mouth 3 (three) times daily as needed for pain., Disp: 10 tablet, Rfl: 0    amoxicillin -clavulanate (AUGMENTIN ) 875-125 MG tablet, Take 1 tablet by mouth every 12 (twelve) hours., Disp: 14 tablet, Rfl: 0   cyclobenzaprine  (FLEXERIL ) 10 MG tablet, Take 1 tablet (10 mg total) by mouth 3 (three) times daily as needed for muscle spasms. (Patient not taking: Reported on 02/03/2024), Disp: 30 tablet, Rfl: 0   lidocaine  (XYLOCAINE ) 2 % solution, Use as directed 15 mLs in the mouth or throat every 4 (four) hours as needed., Disp: 100 mL, Rfl: 0   predniSONE  (STERAPRED UNI-PAK 21 TAB) 10 MG (21) TBPK tablet, Take by mouth daily. Take 6 tabs by mouth daily  for 1 days, then 5 tabs for 1 days, then 4 tabs for 1 days, then 3 tabs for 1 days, 2 tabs for 1 days, then 1 tab by mouth daily for 1 days, Disp: 21 tablet, Rfl: 0  Observations/Objective: Patient is well-developed, well-nourished in no acute distress.  Resting comfortably  Head is normocephalic, atraumatic.  No labored breathing.  Speech is clear and coherent with logical content.  Patient is alert and oriented at baseline.    Assessment and Plan: 1. Suspected UTI (Primary) - nitrofurantoin , macrocrystal-monohydrate, (MACROBID ) 100 MG capsule; Take 1 capsule (100 mg total) by mouth 2 (two) times daily.  Dispense: 10 capsule; Refill: 0 - phenazopyridine (PYRIDIUM) 100 MG tablet; Take 1 tablet (100 mg total) by mouth 3 (three) times daily as needed for pain.  Dispense: 10 tablet; Refill: 0  - Worsening symptoms.  - Will treat empirically with Macrobid  - May use Pyridium for bladder spasms - Continue to push fluids.  - Seek in person evaluation for urine culture if symptoms do not improve or if they worsen.    Follow Up Instructions: I discussed the assessment and treatment plan with the patient. The patient was provided an opportunity to ask questions and all were answered. The patient agreed with the plan and demonstrated an understanding of the instructions.  A copy of instructions were sent to the patient via MyChart  unless otherwise noted below.    The patient was advised to call back or seek an in-person evaluation if the symptoms worsen or if the condition fails to improve as anticipated.    Briana CHRISTELLA Dickinson, PA-C

## 2024-06-10 NOTE — Patient Instructions (Signed)
 Briana Sexton, thank you for joining Briana Sexton CHRISTELLA Dickinson, PA-C for today's virtual visit.  While this provider is not your primary care provider (PCP), if your PCP is located in our provider database this encounter information will be shared with them immediately following your visit.   A [facility] MyChart account gives you access to today's visit and all your visits, tests, and labs performed at [facility]  click here if you don't have a [facility] MyChart account or go to [email or web address]  Consent: (Patient) Briana Sexton provided verbal consent for this virtual visit at the beginning of the encounter.  Current Medications:  Current Outpatient Medications:    nitrofurantoin , macrocrystal-monohydrate, (MACROBID ) 100 MG capsule, Take 1 capsule (100 mg total) by mouth 2 (two) times daily., Disp: 10 capsule, Rfl: 0   phenazopyridine (PYRIDIUM) 100 MG tablet, Take 1 tablet (100 mg total) by mouth 3 (three) times daily as needed for pain., Disp: 10 tablet, Rfl: 0   amoxicillin -clavulanate (AUGMENTIN ) 875-125 MG tablet, Take 1 tablet by mouth every 12 (twelve) hours., Disp: 14 tablet, Rfl: 0   cyclobenzaprine  (FLEXERIL ) 10 MG tablet, Take 1 tablet (10 mg total) by mouth 3 (three) times daily as needed for muscle spasms. (Patient not taking: Reported on 02/03/2024), Disp: 30 tablet, Rfl: 0   lidocaine  (XYLOCAINE ) 2 % solution, Use as directed 15 mLs in the mouth or throat every 4 (four) hours as needed., Disp: 100 mL, Rfl: 0   predniSONE  (STERAPRED UNI-PAK 21 TAB) 10 MG (21) TBPK tablet, Take by mouth daily. Take 6 tabs by mouth daily  for 1 days, then 5 tabs for 1 days, then 4 tabs for 1 days, then 3 tabs for 1 days, 2 tabs for 1 days, then 1 tab by mouth daily for 1 days, Disp: 21 tablet, Rfl: 0   Medications ordered in this encounter:  Meds ordered this encounter  Medications   nitrofurantoin , macrocrystal-monohydrate, (MACROBID ) 100 MG capsule    Sig: Take 1 capsule (100  mg total) by mouth 2 (two) times daily.    Dispense:  10 capsule    Refill:  0    Supervising Provider:   BLAISE ALEENE KIDD [[id number]]   phenazopyridine (PYRIDIUM) 100 MG tablet    Sig: Take 1 tablet (100 mg total) by mouth 3 (three) times daily as needed for pain.    Dispense:  10 tablet    Refill:  0    Supervising Provider:   BLAISE ALEENE KIDD [[id number]]     *If you need refills on other medications prior to your next appointment, please contact your pharmacy*  Follow-Up: Call back or seek an in-person evaluation if the symptoms worsen or if the condition fails to improve as anticipated.  [facility] [phone]  Other Instructions Urinary Tract Infection, Female A urinary tract infection (UTI) is an infection in your urinary tract. The urinary tract is made up of organs that make, store, and get rid of pee (urine) in your body. These organs include: The kidneys. The ureters. The bladder. The urethra. What are the causes? Most UTIs are caused by germs called bacteria. They may be in or near your genitals. These germs grow and cause swelling in your urinary tract. What increases the risk? You're more likely to get a UTI if: You're a female. The urethra is shorter in females than in males. You have a soft tube called a catheter that drains your pee. You can't control when you pee or poop. You  have trouble peeing because of: A kidney stone. A urinary blockage. A nerve condition that affects your bladder. Not getting enough to drink. You're sexually active. You use a birth control inside your vagina, like spermicide. You're pregnant. You have low levels of the hormone estrogen in your body. You're an older adult. You're also more likely to get a UTI if you have other health problems. These may include: Diabetes. A weak immune system. Your immune system is your body's defense system. Sickle cell disease. Injury of the spine. What are the signs or  symptoms? Symptoms may include: Needing to pee right away. Peeing small amounts often. Pain or burning when you pee. Blood in your pee. Pee that smells bad or odd. Pain in your belly or lower back. You may also: Feel confused. This may be the first symptom in older adults. Vomit. Not feel hungry. Feel tired or easily annoyed. Have a fever or chills. How is this diagnosed? A UTI is diagnosed based on your medical history and an exam. You may also have other tests. These may include: Pee tests. Blood tests. Tests for sexually transmitted infections (STIs). If you've had more than one UTI, you may need to have imaging studies done to find out why you keep getting them. How is this treated? A UTI can be treated by: Taking antibiotics or other medicines. Drinking enough fluid to keep your pee pale yellow. In rare cases, a UTI can cause a very bad condition called sepsis. Sepsis may be treated in the hospital. Follow these instructions at home: Medicines Take your medicines only as told by your health care provider. If you were given antibiotics, take them as told by your provider. Do not stop taking them even if you start to feel better. General instructions Make sure you: Pee often and fully. Do not hold your pee for a long time. Wipe from front to back after you pee or poop. Use each tissue only once when you wipe. Pee after you have sex. Do not douche or use sprays or powders in your genital area. Contact a health care provider if: Your symptoms don't get better after 1-2 days of taking antibiotics. Your symptoms go away and then come back. You have a fever or chills. You vomit or feel like you may vomit. Get help right away if: You have very bad pain in your back or lower belly. You faint. This information is not intended to replace advice given to you by your health care provider. Make sure you discuss any questions you have with your health care provider. Document  Revised: 07/24/2023 Document Reviewed: 11/16/2022 Elsevier Patient Education  [employer].   If you have been instructed to have an in-person evaluation today at a local Urgent Care facility, please use the link below. It will take you to a list of all of our available [facility], including address, phone number and hours of operation. Please do not delay care.  [facility]  If you or a family member do not have a primary care provider, use the link below to schedule a visit and establish care. When you choose a [facility] primary care physician or advanced practice provider, you gain a long-term partner in health. Find a Primary Care Provider  Learn more about [facility]'s in-office and virtual care options: [facility] - Get Care Now

## 2024-09-16 ENCOUNTER — Encounter: Payer: Self-pay | Admitting: Emergency Medicine

## 2024-09-16 ENCOUNTER — Ambulatory Visit: Admission: EM | Admit: 2024-09-16 | Discharge: 2024-09-16 | Disposition: A | Discharge status: Home / Self Care

## 2024-09-16 DIAGNOSIS — B372 Candidiasis of skin and nail: Secondary | ICD-10-CM | POA: Diagnosis not present

## 2024-09-16 MED ORDER — NYSTATIN 100000 UNIT/GM EX CREA: 1.0000 | TOPICAL_CREAM | Freq: Two times a day (BID) | CUTANEOUS | 1 refills | Status: AC

## 2024-09-16 NOTE — Discharge Instructions (Signed)
 Today you are evaluated for the rash under your arm, based on the presentation and placement I do have concerns that yeast is the cause of your symptoms and we will start treatment as such  Apply nystatin  cream to the affected area twice daily for at least 2 weeks or until fully healed whichever comes first, if seeing improvement may continue course past 2 weeks  Keep area as dry as possible, this may require you to dry the armpits throughout the day  For itching you may use allergy medicine such as Claritin Zyrtec  or Benadryl   If symptoms continue to persist may follow-up with urgent care or your primary doctor for reevaluate

## 2024-09-16 NOTE — ED Provider Notes (Signed)
 " [facility]    CSN: [id number] Arrival date & time: 09/16/24  1718      History   Chief Complaint Chief Complaint  Patient presents with   Rash    HPI Briana Sexton is a 34 y.o. female.   Rash under armpits, itchis, for 2 months, has not worsesn ,has been moist at times but no drainge,   No pain   Anti itch, not helpful   No changes prior,   Past Medical History:  Diagnosis Date   Abnormal uterine bleeding    Depression    GERD (gastroesophageal reflux disease)    Headache    MIGRAINES   Pre-diabetes     Patient Active Problem List   Diagnosis Date Noted   MDD (major depressive disorder) 05/12/2018   Tobacco use 10/09/2017   Morbid obesity (HCC) 10/09/2017   Calculus of gallbladder without cholecystitis without obstruction     Past Surgical History:  Procedure Laterality Date   CHOLECYSTECTOMY N/A 01/30/2017   Procedure: LAPAROSCOPIC CHOLECYSTECTOMY;  Surgeon: Nicholaus Selinda Birmingham, MD;  Location: [facility] ORS;  Service: General;  Laterality: N/A;   NO PAST SURGERIES      OB History     Gravida  0   Para  0   Term  0   Preterm  0   AB  0   Living  0      SAB  0   IAB  0   Ectopic  0   Multiple  0   Live Births  0            Home Medications    Prior to Admission medications  Medication Sig Start Date End Date Taking? Authorizing Provider  atomoxetine (STRATTERA) 40 MG capsule Take 40 mg by mouth every morning. 09/06/24  Yes [provider]  FLUoxetine (PROZAC) 20 MG capsule Take 20 mg by mouth daily. 09/06/24  Yes [provider]  amoxicillin -clavulanate (AUGMENTIN ) 875-125 MG tablet Take 1 tablet by mouth every 12 (twelve) hours. 02/09/24   Yuna Pizzolato, Shelba SAUNDERS, NP  cyclobenzaprine  (FLEXERIL ) 10 MG tablet Take 1 tablet (10 mg total) by mouth 3 (three) times daily as needed for muscle spasms. Patient not taking: Reported on 02/03/2024 09/24/23   Gladis Elsie BROCKS, PA-C  lidocaine  (XYLOCAINE ) 2 % solution Use as  directed 15 mLs in the mouth or throat every 4 (four) hours as needed. 02/05/24   Keny Donald, Shelba SAUNDERS, NP  nitrofurantoin , macrocrystal-monohydrate, (MACROBID ) 100 MG capsule Take 1 capsule (100 mg total) by mouth 2 (two) times daily. 06/10/24   Vivienne Delon HERO, PA-C  phenazopyridine  (PYRIDIUM ) 100 MG tablet Take 1 tablet (100 mg total) by mouth 3 (three) times daily as needed for pain. 06/10/24   Vivienne Delon HERO, PA-C  predniSONE  (STERAPRED UNI-PAK 21 TAB) 10 MG (21) TBPK tablet Take by mouth daily. Take 6 tabs by mouth daily  for 1 days, then 5 tabs for 1 days, then 4 tabs for 1 days, then 3 tabs for 1 days, 2 tabs for 1 days, then 1 tab by mouth daily for 1 days 02/05/24   Teresa Shelba SAUNDERS, NP    Family History Family History  Problem Relation Age of Onset   Hypertension Sister    Diabetes Maternal Uncle    Thyroid  disease Mother     Social History Social History[1]   Allergies   Corn-containing products, Dairy aid [tilactase], Peanut-containing drug products, and Walnut   Review of Systems Review of Systems  Skin:  Positive for rash.     Physical Exam Triage Vital Signs ED Triage Vitals  Encounter Vitals Group     BP 09/16/24 1730 136/87     Girls Systolic BP Percentile --      Girls Diastolic BP Percentile --      Boys Systolic BP Percentile --      Boys Diastolic BP Percentile --      Pulse Rate 09/16/24 1730 95     Resp 09/16/24 1730 20     Temp 09/16/24 1730 98 F (36.7 C)     Temp Source 09/16/24 1730 Oral     SpO2 09/16/24 1730 95 %     Weight --      Height --      Head Circumference --      Peak Flow --      Pain Score 09/16/24 1732 0     Pain Loc --      Pain Education --      Exclude from Growth Chart --    No data found.  Updated Vital Signs BP 136/87 (BP Location: Right Arm)   Pulse 95   Temp 98 F (36.7 C) (Oral)   Resp 20   LMP 09/01/2024 (Approximate)   SpO2 95%   Visual Acuity Right Eye Distance:   Left Eye Distance:    Bilateral Distance:    Right Eye Near:   Left Eye Near:    Bilateral Near:     Physical Exam   UC Treatments / Results  Labs (all labs ordered are listed, but only abnormal results are displayed) Labs Reviewed - No data to display  EKG   Radiology No results found.  Procedures Procedures (including critical care time)  Medications Ordered in UC Medications - No data to display  Initial Impression / Assessment and Plan / UC Course  I have reviewed the triage vital signs and the nursing notes.  Pertinent labs & imaging results that were available during my care of the patient were reviewed by me and considered in my medical decision making (see chart for details).     *** Final Clinical Impressions(s) / UC Diagnoses   Final diagnoses:  None   Discharge Instructions   None    ED Prescriptions   None    PDMP not reviewed this encounter.    [1]  Social History Tobacco Use   Smoking status: Some Days    Current packs/day: 0.50    Average packs/day: 0.5 packs/day for 3.0 years (1.5 ttl pk-yrs)    Types: Cigarettes   Smokeless tobacco: Never  Vaping Use   Vaping status: Never Used  Substance Use Topics   Alcohol use: Yes    Alcohol/week: 0.0 standard drinks of alcohol    Comment: OCC-ONCE WEEKLY   Drug use: Not Currently    Types: Marijuana    Comment: VERY RARE   "

## 2024-09-16 NOTE — ED Triage Notes (Signed)
 Patient reports rash under bilateral armpits x 2 months. Patient has used anti-itch cream with no relief.

## 2024-09-17 ENCOUNTER — Ambulatory Visit: Payer: Self-pay
